# Patient Record
Sex: Female | Born: 1995
Health system: Southern US, Community
[De-identification: ages and names within clinical notes are randomized; demographics above are authoritative.]

## PROBLEM LIST (undated history)

## (undated) DIAGNOSIS — F1219 Cannabis abuse with unspecified cannabis-induced disorder: Secondary | ICD-10-CM

## (undated) DIAGNOSIS — F1299 Cannabis use, unspecified with unspecified cannabis-induced disorder: Secondary | ICD-10-CM

## (undated) DIAGNOSIS — F1290 Cannabis use, unspecified, uncomplicated: Secondary | ICD-10-CM

## (undated) DIAGNOSIS — F32A Depression, unspecified: Secondary | ICD-10-CM

## (undated) DIAGNOSIS — F329 Major depressive disorder, single episode, unspecified: Secondary | ICD-10-CM

## (undated) DIAGNOSIS — R112 Nausea with vomiting, unspecified: Secondary | ICD-10-CM

## (undated) DIAGNOSIS — R1115 Cyclical vomiting syndrome unrelated to migraine: Secondary | ICD-10-CM

## (undated) HISTORY — PX: NO PAST SURGERIES: SHX2092

---
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
## 2014-07-19 ENCOUNTER — Encounter (HOSPITAL_BASED_OUTPATIENT_CLINIC_OR_DEPARTMENT_OTHER): Payer: Self-pay | Admitting: *Deleted

## 2014-07-19 ENCOUNTER — Emergency Department (HOSPITAL_BASED_OUTPATIENT_CLINIC_OR_DEPARTMENT_OTHER)
Admission: EM | Admit: 2014-07-19 | Discharge: 2014-07-19 | Disposition: A | Discharge status: Home / Self Care | Payer: Medicaid Other | Attending: Emergency Medicine | Admitting: Emergency Medicine

## 2014-07-19 DIAGNOSIS — K088 Other specified disorders of teeth and supporting structures: Secondary | ICD-10-CM | POA: Diagnosis not present

## 2014-07-19 DIAGNOSIS — Z720 Tobacco use: Secondary | ICD-10-CM | POA: Diagnosis not present

## 2014-07-19 DIAGNOSIS — K0889 Other specified disorders of teeth and supporting structures: Secondary | ICD-10-CM

## 2014-07-19 DIAGNOSIS — K0381 Cracked tooth: Secondary | ICD-10-CM | POA: Insufficient documentation

## 2014-07-19 MED ORDER — HYDROCODONE-ACETAMINOPHEN 5-325 MG PO TABS
1.0000 | ORAL_TABLET | Freq: Once | ORAL | Status: AC
  Administered 2014-07-19: 1 via ORAL
  Filled 2014-07-19: qty 1

## 2014-07-19 MED ORDER — NAPROXEN 250 MG PO TABS
500.0000 mg | ORAL_TABLET | Freq: Once | ORAL | Status: AC
  Administered 2014-07-19: 500 mg via ORAL
  Filled 2014-07-19: qty 2

## 2014-07-19 MED ORDER — HYDROCODONE-ACETAMINOPHEN 5-325 MG PO TABS: 1.0000 | ORAL_TABLET | ORAL | Status: DC | PRN

## 2014-07-19 MED ORDER — PENICILLIN V POTASSIUM 500 MG PO TABS: 500.0000 mg | ORAL_TABLET | Freq: Four times a day (QID) | ORAL | Status: AC

## 2014-07-19 MED ORDER — NAPROXEN 500 MG PO TABS: 500.0000 mg | ORAL_TABLET | Freq: Two times a day (BID) | ORAL | Status: DC

## 2014-07-19 NOTE — ED Notes (Signed)
D/c home with ride- rx x 3 given for PCN, Vicodin, and naproxen

## 2014-07-19 NOTE — Discharge Instructions (Signed)
Please follow the directions provided. Use the resource guide to establish care with a dentist for treatment of this tooth pain. Please take the antibiotics until they are all gone. Please take the naproxen twice a day to help with pain and inflammation. Take the Vicodin for pain not relieved by the naproxen. Don't hesitate to return for any new, worsening, or concerning symptoms.   SEEK IMMEDIATE MEDICAL CARE IF:  You have a fever.  You develop redness and swelling of your face, jaw, or neck.  You are unable to open your mouth.  You have severe pain uncontrolled by pain medicine.    Emergency Department Resource Guide 1) Find a Doctor and Pay Out of Pocket Although you won't have to find out who is covered by your insurance plan, it is a good idea to ask around and get recommendations. You will then need to call the office and see if the doctor you have chosen will accept you as a new patient and what types of options they offer for patients who are self-pay. Some doctors offer discounts or will set up payment plans for their patients who do not have insurance, but you will need to ask so you aren't surprised when you get to your appointment.  2) Contact Your Local Health Department Not all health departments have doctors that can see patients for sick visits, but many do, so it is worth a call to see if yours does. If you don't know where your local health department is, you can check in your phone book. The CDC also has a tool to help you locate your state's health department, and many state websites also have listings of all of their local health departments.  3) Find a Walk-in Clinic If your illness is not likely to be very severe or complicated, you may want to try a walk in clinic. These are popping up all over the country in pharmacies, drugstores, and shopping centers. They're usually staffed by nurse practitioners or physician assistants that have been trained to treat common illnesses  and complaints. They're usually fairly quick and inexpensive. However, if you have serious medical issues or chronic medical problems, these are probably not your best option.  No Primary Care Doctor: - Call Health Connect at  [phone] - they can help you locate a primary care doctor that  accepts your insurance, provides certain services, etc. - Physician Referral Service- [phone]  Chronic Pain Problems: Organization         Address  Phone   Notes  [facility]  [phone] Patients need to be referred by their primary care doctor.   Medication Assistance: Organization         Address  Phone   Notes  [facility] [address] [place], [place] [address] [phone] --Must be a resident of [place] -- Must have NO insurance coverage whatsoever (no Medicaid/ Medicare, etc.) -- The pt. MUST have a primary care doctor that directs their care regularly and follows them in the community   MedAssist  [phone]   [employer]  [phone]    Agencies that provide inexpensive medical care: Organization         Address  Phone   Notes  [facility]  [phone]   [facility]    [phone]   [facility] [address] [place], [place] [address] [phone]  [facility] [address] [place] [phone]   Planned Parenthood    [phone]   [facility]    [phone]   [place] and [place] [address], [place] Phone:  [phone], Fax:  [phone] Hours of Operation:  9 am - 6 pm, M-F.  Also accepts Medicaid/Medicare and self-pay.  [facility] for [place] [address], [address], [place] Phone: [phone], Fax: [phone]. Hours of Operation:  8:30 am - 5:30 pm, M-F.  Also accepts Medicaid and self-pay.  [facility] [place] [address] [place] Phone: [phone]   [place], [place], [place] [phone], Ext. [phone] Mondays & Thursdays: 7-9 AM.  First 15 patients are seen on a first come, first serve basis.    [place] Providers:  Organization         Address  Phone   Notes  [facility] [address], [place] [phone] Also accepts self-pay patients.  [facility] [address], [place]  [phone]   [place], [address], [place] [phone]   [facility] [address], [place] [phone]   Lucianne Lei [address], [place]   [phone] Only accepts [place] Access [place] patients after they have their name applied to their card.   Self-Pay (no insurance) in [facility] [place]:  Organization         Address  Phone   Notes  Sickle Cell Patients, [facility] [place] [phone]   [facility] [place] [phone]   [facility] [place]  [place], [place], [place] [phone]   [facility]/Dr. Osei-Bonsu  [address] [place] or [address], [place] [phone] Phone number for both [place] and [place] locations is the same.  Urgent Medical and [facility] [address] [place] [phone]   [facility] [address] [place] or [address] [phone]   [facility] [address], [place] [phone], phone; [phone], fax Sees patients 1st and 3rd Saturday of every month.  Must not qualify for public or private insurance (i.e. Medicaid, Medicare, [place] Health Choice, Veterans' Benefits)  Household income should be no more than 200% of the poverty level  The clinic cannot treat you if you are pregnant or think you are pregnant  Sexually transmitted diseases are not treated at the clinic.    Dental Care: Organization         Address  Phone  Notes  [facility] [place] [phone] Accepts children up to age 97 who are enrolled in [place] or [place]; pregnant women with a Medicaid card; and children who have applied for Medicaid or [place] Health Choice, but were declined, whose parents can pay a reduced fee at time of service.  [facility]  [address], [place] [phone] Accepts children up to age 11 who are enrolled in [place] or [place]; pregnant women with a Medicaid card; and  children who have applied for Medicaid or [place] Health Choice, but were declined, whose parents can pay a reduced fee at time of service.  [place] Adult [facility] Access PROGRAM  [place] [phone] Patients are seen by appointment only. Walk-ins are not accepted. [place] will see patients 65 years of age and older. Monday - Tuesday (8am-5pm) Most Wednesdays (8:30-5pm) $30 per visit, cash only  [place] [place] [facility] Access PRO[facility]  [address], [facility] [phone] Patients are seen by appointment only. Walk-ins are not accepted. [place] will see patients 17 years of age and older. One Wednesday Evening (Monthly: Volunteer Based).  $30 per visit, cash only  [place]  [phone] for adults; Children under age 53, call Graduate Pediatric Dentistry at [phone]. Children aged 31-14, please call [phone] to request a pediatric application.  Dental services are provided in all areas of dental care including fillings, crowns and bridges, complete and partial dentures, implants, gum treatment, root canals, and extractions. Preventive care is  also provided. Treatment is provided to both adults and children. Patients are selected via a lottery and there is often a waiting list.   [facility] [address] [place]  [phone] [email or web address]   [facility] [address] [place], [place] [phone], Ext. [phone] Second and Fourth Thursday of each month, opens at 6:30 AM; Clinic ends at 9 AM.  Patients are seen on a first-come first-served basis, and a limited number are seen during each clinic.   [facility]  [address] [place] Danker [place], [place] [phone]   Eligibility Requirements You must have lived in [place], [place], or [place] counties for at least the last three months.   You cannot be eligible for state or federal sponsored [employer], including [employer], [place], or [employer].   You generally cannot be eligible for healthcare insurance through your employer.    How to apply: Eligibility screenings are held every Tuesday and Wednesday afternoon from 1:00 pm until 4:00 pm. You do not need an appointment for the interview!  [facility] [address] [place], [place]   [place]  [place] Department  [place]  [phone]    Behavioral Health Resources in the Community: Intensive Outpatient Programs Organization         Address  Phone  Notes  [place] [address] [place], [place] [phone]   [facility]patient [address], [place], [place]   ADS: Alcohol & Drug Svcs [address] [place], [place]   [place] [address],  [place], [place] or [phone]   Substance Abuse Resources Organization         Address  Phone  Notes  Alcohol and Drug Services  [phone]   [place]  [phone]   [facility] [place]   Chinita Pester  [phone]   Residential & Outpatient Substance Abuse Program  [phone]   Psychological Services Organization         Address  Phone  Notes  [facility] [place]  [place]  [phone]   [place] [address], [place] or [phone]    Mobile Crisis Teams Organization  Address  Phone  Notes  [facility], Mobile Crisis Care Unit  [phone]   [facility] Psychotherapeutic [facility]  [address] [place], [place]   [facility] [address], [place] [phone]    Self-Help/Support Groups Organization         Address  Phone             Notes  [place]. of [place] - variety of support groups  [place] Call for more information  Narcotics Anonymous (NA), [facility] [address], [employer] [place]  2 meetings at this location   Special educational needs teacher         Address  Phone  Notes  [facility] [place],    [place]  [phone]   [facility]  [address] [place] [phone], [place], [place]   [place], [place] [phone] Admissions: 8am-3pm M-F  Incentives Substance [place] [address]    [place], [place] [phone]   [facility] [address] [place], [place], [place]   [facility] [address],  [place], [place]   [facility] - Intensive Outpatient [address], [place], [place]   [facility].,  [place], [place] [phone] or [phone]   [facility] (RTS) [address] [place], [place] Accepts Medicaid  [facility] [place] [address]  [place] [phone]  Substance Abuse/Addiction Treatment   [facility] Organization         Address  Phone  Notes  [facility]  [phone]   Domenic Schwab, PhD [address] [place]lis Port[place], [place]   [phone] or [phone]   [place], [place] [phone]   [facility] [address] [place], [place] [phone] Insurance/Medicaid/sponsorship through [facility] and Families [address], [address] [place]                                    [place], [place] [phone] [place] [address], [place] [phone]    Dr. Adele Schilder  [phone]   [facility] Dept. 1) [address] [place] 2) [place] 3)  [place] [address], [place] [phone]  [phone]   [place] [phone] or [phone] (After Hours)

## 2014-07-19 NOTE — ED Provider Notes (Signed)
CSN: [id number]     Arrival date & time 07/19/14  1858 History   First MD Initiated Contact with Patient 07/19/14 1905     Chief Complaint  Patient presents with  . Dental Pain   (Consider location/radiation/quality/duration/timing/severity/associated sxs/prior Treatment) HPI  Maria Farmer is a 19 yo female presenting with report of tooth pain. She states her pains began last night. She states she has a chipped tooth, but has been chipped for "a while". It has hurt her in the past. This pain began last night while she was at work. She describes the pain as throbbing and currently rates as a 7 out of 10. She states she is tried ibuprofen which helped but the pain continues after the ibuprofen wears off. She denies any fevers, numbness, facial swelling, nausea or vomiting.   History reviewed. No pertinent past medical history. History reviewed. No pertinent past surgical history. History reviewed. No pertinent family history. History  Substance Use Topics  . Smoking status: Current Every Day Smoker -- 0.50 packs/day    Types: Cigarettes  . Smokeless tobacco: Not on file  . Alcohol Use: No   OB History    No data available     Review of Systems  Constitutional: Negative for fever.  HENT: Positive for dental problem. Negative for facial swelling.       Allergies  Review of patient's allergies indicates no known allergies.  Home Medications   Prior to Admission medications   Medication Sig Start Date End Date Taking? Authorizing Provider  etonogestrel (IMPLANON) 68 MG IMPL implant Inject 1 each into the skin once.    Historical Provider, MD   BP 113/54 mmHg  Pulse 80  Temp(Src) 98.2 F (36.8 C) (Oral)  Resp 16  Ht 5\' 2"  (1.575 m)  Wt 130 lb (58.968 kg)  BMI 23.77 kg/m2  SpO2 100% Physical Exam  Constitutional: She appears well-developed and well-nourished. No distress.  HENT:  Head: Normocephalic and atraumatic.  Mouth/Throat: No trismus in the jaw. No dental  abscesses.    Eyes: Conjunctivae are normal.  Neck: Neck supple.  Cardiovascular: Normal rate and regular rhythm.   Pulmonary/Chest: Effort normal and breath sounds normal. No respiratory distress.  Abdominal: Soft.  Lymphadenopathy:    She has no cervical adenopathy.  Neurological: She is alert.  Skin: Skin is warm and dry. She is not diaphoretic.  Psychiatric: She has a normal mood and affect.  Nursing note and vitals reviewed.   ED Course  Procedures (including critical care time) Labs Review Labs Reviewed - No data to display  Imaging Review No results found.   EKG Interpretation None      MDM   Final diagnoses:  Pain, dental   19 yo with toothache but no gross abscess, facial swelling or concern for Ludwig's angina or spread of infection.  Discussed treatment with penicillin and pain medicine.  Resources provided to follow-up with dentist.  Pt verbalizes understanding.    Filed Vitals:   07/19/14 1903  BP: 113/54  Pulse: 80  Temp: 98.2 F (36.8 C)  TempSrc: Oral  Resp: 16  Height: 5\' 2"  (1.575 m)  Weight: 130 lb (58.968 kg)  SpO2: 100%   Meds given in ED:  Medications  HYDROcodone-acetaminophen (NORCO/VICODIN) 5-325 MG per tablet 1 tablet (1 tablet Oral Given 07/19/14 1939)  naproxen (NAPROSYN) tablet 500 mg (500 mg Oral Given 07/19/14 1940)    Discharge Medication List as of 07/19/2014  7:29 PM    START taking these  medications   Details  HYDROcodone-acetaminophen (NORCO/VICODIN) 5-325 MG per tablet Take 1 tablet by mouth every 4 (four) hours as needed for moderate pain or severe pain., Starting 07/19/2014, Until Discontinued, Print    naproxen (NAPROSYN) 500 MG tablet Take 1 tablet (500 mg total) by mouth 2 (two) times daily., Starting 07/19/2014, Until Discontinued, Print    penicillin v potassium (VEETID) 500 MG tablet Take 1 tablet (500 mg total) by mouth 4 (four) times daily., Starting 07/19/2014, Until Mon 07/26/14, Print          Harle Battiest, NP 07/20/14 1239  Elwin Mocha, MD 07/22/14 [phone]

## 2014-07-19 NOTE — ED Notes (Signed)
Pt c/o dental pain x 24 hrs

## 2014-08-23 ENCOUNTER — Encounter (HOSPITAL_BASED_OUTPATIENT_CLINIC_OR_DEPARTMENT_OTHER): Payer: Self-pay | Admitting: Emergency Medicine

## 2014-08-23 ENCOUNTER — Emergency Department (HOSPITAL_BASED_OUTPATIENT_CLINIC_OR_DEPARTMENT_OTHER): Payer: Medicaid Other

## 2014-08-23 ENCOUNTER — Emergency Department (HOSPITAL_BASED_OUTPATIENT_CLINIC_OR_DEPARTMENT_OTHER)
Admission: EM | Admit: 2014-08-23 | Discharge: 2014-08-23 | Disposition: A | Discharge status: Home / Self Care | Payer: Medicaid Other | Attending: Emergency Medicine | Admitting: Emergency Medicine

## 2014-08-23 DIAGNOSIS — Z3202 Encounter for pregnancy test, result negative: Secondary | ICD-10-CM | POA: Diagnosis not present

## 2014-08-23 DIAGNOSIS — R05 Cough: Secondary | ICD-10-CM

## 2014-08-23 DIAGNOSIS — Z720 Tobacco use: Secondary | ICD-10-CM | POA: Insufficient documentation

## 2014-08-23 DIAGNOSIS — B349 Viral infection, unspecified: Secondary | ICD-10-CM | POA: Diagnosis not present

## 2014-08-23 DIAGNOSIS — R059 Cough, unspecified: Secondary | ICD-10-CM

## 2014-08-23 DIAGNOSIS — R109 Unspecified abdominal pain: Secondary | ICD-10-CM | POA: Diagnosis not present

## 2014-08-23 LAB — URINALYSIS, ROUTINE W REFLEX MICROSCOPIC
Bilirubin Urine: NEGATIVE
GLUCOSE, UA: NEGATIVE mg/dL
HGB URINE DIPSTICK: NEGATIVE
Ketones, ur: NEGATIVE mg/dL
Nitrite: NEGATIVE
PROTEIN: NEGATIVE mg/dL
SPECIFIC GRAVITY, URINE: 1.029 (ref 1.005–1.030)
Urobilinogen, UA: 1 mg/dL (ref 0.0–1.0)
pH: 6.5 (ref 5.0–8.0)

## 2014-08-23 LAB — URINE MICROSCOPIC-ADD ON

## 2014-08-23 LAB — PREGNANCY, URINE: Preg Test, Ur: NEGATIVE

## 2014-08-23 MED ORDER — ALBUTEROL SULFATE HFA 108 (90 BASE) MCG/ACT IN AERS: 1.0000 | INHALATION_SPRAY | Freq: Four times a day (QID) | RESPIRATORY_TRACT | Status: DC | PRN

## 2014-08-23 NOTE — ED Provider Notes (Signed)
CSN: [id number]     Arrival date & time 08/23/14  1610 History  This chart was scribed for Glynn OctaveStephen Haidar Muse, MD by Freida Busmaniana Omoyeni, ED Scribe. This patient was seen in room MH12/MH12 and the patient's care was started 5:35 PM.    Chief Complaint  Patient presents with  . Emesis  . Cough     The history is provided by the patient. No language interpreter was used.     HPI Comments:  Maria Farmer is a 19 y.o. female who presents to the Emergency Department complaining of  productive cough that began 5 day ago. She reports associated sore throat, mild intermittent abdominal pain and vomiting. Pt last vomited this morning and notes that some episode have occurred after coughing.  She denies fever. Pt's daughter has had similar symptoms for ~2 weeks. Pt has been taking OTC cough meds with little relief.  LNMP ~1-2 weeks ago. Pt is currently a smoker.     History reviewed. No pertinent past medical history. History reviewed. No pertinent past surgical history. History reviewed. No pertinent family history. History  Substance Use Topics  . Smoking status: Current Every Day Smoker -- 0.50 packs/day    Types: Cigarettes  . Smokeless tobacco: Not on file  . Alcohol Use: No   OB History    No data available     Review of Systems  Constitutional: Negative for fever.  HENT: Positive for sore throat.   Respiratory: Positive for cough.   Gastrointestinal: Positive for vomiting and abdominal pain.  All other systems reviewed and are negative.     Allergies  Review of patient's allergies indicates no known allergies.  Home Medications   Prior to Admission medications   Medication Sig Start Date End Date Taking? Authorizing Provider  albuterol (PROVENTIL HFA;VENTOLIN HFA) 108 (90 BASE) MCG/ACT inhaler Inhale 1-2 puffs into the lungs every 6 (six) hours as needed for wheezing or shortness of breath. 08/23/14   Glynn OctaveStephen Rosalea Withrow, MD  etonogestrel (IMPLANON) 68 MG IMPL implant Inject 1  each into the skin once.    Historical Provider, MD  HYDROcodone-acetaminophen (NORCO/VICODIN) 5-325 MG per tablet Take 1 tablet by mouth every 4 (four) hours as needed for moderate pain or severe pain. 07/19/14   Harle BattiestElizabeth Tysinger, NP  naproxen (NAPROSYN) 500 MG tablet Take 1 tablet (500 mg total) by mouth 2 (two) times daily. 07/19/14   Harle BattiestElizabeth Tysinger, NP   BP 118/70 mmHg  Pulse 96  Temp(Src) 98.5 F (36.9 C) (Oral)  Resp 18  Ht 5\' 2"  (1.575 m)  Wt 125 lb (56.7 kg)  BMI 22.86 kg/m2  SpO2 99%  LMP 08/09/2014 Physical Exam  Constitutional: She is oriented to person, place, and time. She appears well-developed and well-nourished. No distress.  HENT:  Head: Normocephalic and atraumatic.  Mouth/Throat: Oropharynx is clear and moist. No oropharyngeal exudate.  Eyes: Conjunctivae and EOM are normal. Pupils are equal, round, and reactive to light.  Neck: Normal range of motion. Neck supple.  No meningismus.  Cardiovascular: Normal rate, regular rhythm, normal heart sounds and intact distal pulses.   No murmur heard. Pulmonary/Chest: Effort normal and breath sounds normal. No respiratory distress.  Dry cough noted  Abdominal: Soft. There is no tenderness. There is no rebound and no guarding.  Musculoskeletal: Normal range of motion. She exhibits no edema or tenderness.  Neurological: She is alert and oriented to person, place, and time. No cranial nerve deficit. She exhibits normal muscle tone. Coordination normal.  No ataxia on  finger to nose bilaterally. No pronator drift. 5/5 strength throughout. CN 2-12 intact. Negative Romberg. Equal grip strength. Sensation intact. Gait is normal.   Skin: Skin is warm.  Psychiatric: She has a normal mood and affect. Her behavior is normal.  Nursing note and vitals reviewed.   ED Course  Procedures   DIAGNOSTIC STUDIES:  Oxygen Saturation is 99% on RA, normal by my interpretation.    COORDINATION OF CARE:  5:42 PM Discussed treatment plan  with pt at bedside and pt agreed to plan.  Labs Review Labs Reviewed  URINALYSIS, ROUTINE W REFLEX MICROSCOPIC - Abnormal; Notable for the following:    APPearance CLOUDY (*)    Leukocytes, UA SMALL (*)    All other components within normal limits  URINE MICROSCOPIC-ADD ON - Abnormal; Notable for the following:    Squamous Epithelial / LPF MANY (*)    Bacteria, UA FEW (*)    All other components within normal limits  PREGNANCY, URINE    Imaging Review Dg Chest 2 View  08/23/2014   CLINICAL DATA:  Productive cough, vomiting  EXAM: CHEST  2 VIEW  COMPARISON:  None.  FINDINGS: Lungs are clear.  No pleural effusion or pneumothorax.  The heart is normal in size.  Visualized osseous structures are within normal limits.  IMPRESSION: Normal chest radiographs.   Electronically Signed   By: Charline Bills M.D.   On: 08/23/2014 17:48     EKG Interpretation None      MDM   Final diagnoses:  Viral syndrome  Cough   4 day history of coughing with posttussive emesis, congestion. Daughter with similar symptoms. No document a fever.  Chest x-ray negative. Tolerating by mouth in the ED. No hypoxia or wheezing. Supportive care for viral bronchitis. Smoking cessation discussed.  I personally performed the services described in this documentation, which was scribed in my presence. The recorded information has been reviewed and is accurate.   Glynn Octave, MD 08/24/14 [phone]

## 2014-08-23 NOTE — ED Notes (Signed)
Pt states she has been vomiting since Thursday

## 2014-08-23 NOTE — Discharge Instructions (Signed)
Cough, Adult  A cough is a reflex that helps clear your throat and airways. It can help heal the body or may be a reaction to an irritated airway. A cough may only last 2 or 3 weeks (acute) or may last more than 8 weeks (chronic).  CAUSES Acute cough:  Viral or bacterial infections. Chronic cough:  Infections.  Allergies.  Asthma.  Post-nasal drip.  Smoking.  Heartburn or acid reflux.  Some medicines.  Chronic lung problems (COPD).  Cancer. SYMPTOMS   Cough.  Fever.  Chest pain.  Increased breathing rate.  High-pitched whistling sound when breathing (wheezing).  Colored mucus that you cough up (sputum). TREATMENT   A bacterial cough may be treated with antibiotic medicine.  A viral cough must run its course and will not respond to antibiotics.  Your caregiver may recommend other treatments if you have a chronic cough. HOME CARE INSTRUCTIONS   Only take over-the-counter or prescription medicines for pain, discomfort, or fever as directed by your caregiver. Use cough suppressants only as directed by your caregiver.  Use a cold steam vaporizer or humidifier in your bedroom or home to help loosen secretions.  Sleep in a semi-upright position if your cough is worse at night.  Rest as needed.  Stop smoking if you smoke. SEEK IMMEDIATE MEDICAL CARE IF:   You have pus in your sputum.  Your cough starts to worsen.  You cannot control your cough with suppressants and are losing sleep.  You begin coughing up blood.  You have difficulty breathing.  You develop pain which is getting worse or is uncontrolled with medicine.  You have a fever. MAKE SURE YOU:   Understand these instructions.  Will watch your condition.  Will get help right away if you are not doing well or get worse. Document Released: 11/24/2010 Document Revised: 08/20/2011 Document Reviewed: 11/24/2010 ExitCare Patient Information 2015 [employer]. This information is not intended  to replace advice given to you by your health care provider. Make sure you discuss any questions you have with your health care provider.  

## 2014-12-28 ENCOUNTER — Encounter (HOSPITAL_BASED_OUTPATIENT_CLINIC_OR_DEPARTMENT_OTHER): Payer: Self-pay | Admitting: *Deleted

## 2014-12-28 ENCOUNTER — Inpatient Hospital Stay (HOSPITAL_BASED_OUTPATIENT_CLINIC_OR_DEPARTMENT_OTHER)
Admission: EM | Admit: 2014-12-28 | Discharge: 2014-12-31 | DRG: 392 | Disposition: A | Discharge status: Home / Self Care | Payer: Medicaid Other | Attending: Internal Medicine | Admitting: Internal Medicine

## 2014-12-28 DIAGNOSIS — R109 Unspecified abdominal pain: Secondary | ICD-10-CM | POA: Diagnosis not present

## 2014-12-28 DIAGNOSIS — Z7151 Drug abuse counseling and surveillance of drug abuser: Secondary | ICD-10-CM | POA: Diagnosis present

## 2014-12-28 DIAGNOSIS — N390 Urinary tract infection, site not specified: Secondary | ICD-10-CM

## 2014-12-28 DIAGNOSIS — R1115 Cyclical vomiting syndrome unrelated to migraine: Secondary | ICD-10-CM

## 2014-12-28 DIAGNOSIS — R112 Nausea with vomiting, unspecified: Secondary | ICD-10-CM | POA: Diagnosis not present

## 2014-12-28 DIAGNOSIS — F17210 Nicotine dependence, cigarettes, uncomplicated: Secondary | ICD-10-CM | POA: Diagnosis present

## 2014-12-28 DIAGNOSIS — R1110 Vomiting, unspecified: Secondary | ICD-10-CM | POA: Diagnosis not present

## 2014-12-28 DIAGNOSIS — F1290 Cannabis use, unspecified, uncomplicated: Secondary | ICD-10-CM | POA: Diagnosis present

## 2014-12-28 HISTORY — DX: Depression, unspecified: F32.A

## 2014-12-28 HISTORY — DX: Major depressive disorder, single episode, unspecified: F32.9

## 2014-12-28 LAB — CBC WITH DIFFERENTIAL/PLATELET
BASOS PCT: 0 % (ref 0–1)
Basophils Absolute: 0 10*3/uL (ref 0.0–0.1)
Eosinophils Absolute: 0.1 10*3/uL (ref 0.0–0.7)
Eosinophils Relative: 1 % (ref 0–5)
HCT: 39.6 % (ref 36.0–46.0)
HEMOGLOBIN: 13.9 g/dL (ref 12.0–15.0)
LYMPHS ABS: 2.2 10*3/uL (ref 0.7–4.0)
LYMPHS PCT: 35 % (ref 12–46)
MCH: 30.6 pg (ref 26.0–34.0)
MCHC: 35.1 g/dL (ref 30.0–36.0)
MCV: 87.2 fL (ref 78.0–100.0)
Monocytes Absolute: 0.6 10*3/uL (ref 0.1–1.0)
Monocytes Relative: 9 % (ref 3–12)
NEUTROS ABS: 3.4 10*3/uL (ref 1.7–7.7)
NEUTROS PCT: 55 % (ref 43–77)
Platelets: 210 10*3/uL (ref 150–400)
RBC: 4.54 MIL/uL (ref 3.87–5.11)
RDW: 12.7 % (ref 11.5–15.5)
WBC: 6.3 10*3/uL (ref 4.0–10.5)

## 2014-12-28 LAB — COMPREHENSIVE METABOLIC PANEL
ALBUMIN: 4.5 g/dL (ref 3.5–5.0)
ALT: 11 U/L — ABNORMAL LOW (ref 14–54)
ANION GAP: 10 (ref 5–15)
AST: 20 U/L (ref 15–41)
Alkaline Phosphatase: 47 U/L (ref 38–126)
BILIRUBIN TOTAL: 0.8 mg/dL (ref 0.3–1.2)
BUN: 6 mg/dL (ref 6–20)
CALCIUM: 9.5 mg/dL (ref 8.9–10.3)
CO2: 24 mmol/L (ref 22–32)
Chloride: 105 mmol/L (ref 101–111)
Creatinine, Ser: 0.89 mg/dL (ref 0.44–1.00)
GFR calc non Af Amer: >60 mL/min (ref >60)
Glucose, Bld: 113 mg/dL — ABNORMAL HIGH (ref 65–99)
Potassium: 3.5 mmol/L (ref 3.5–5.1)
SODIUM: 139 mmol/L (ref 135–145)
Total Protein: 8.2 g/dL — ABNORMAL HIGH (ref 6.5–8.1)

## 2014-12-28 LAB — LIPASE, BLOOD: Lipase: 20 U/L — ABNORMAL LOW (ref 22–51)

## 2014-12-28 LAB — URINALYSIS, ROUTINE W REFLEX MICROSCOPIC
Bilirubin Urine: NEGATIVE
Glucose, UA: NEGATIVE mg/dL
KETONES UR: NEGATIVE mg/dL
Nitrite: NEGATIVE
PROTEIN: 100 mg/dL — AB
Specific Gravity, Urine: 1.014 (ref 1.005–1.030)
Urobilinogen, UA: 0.2 mg/dL (ref 0.0–1.0)
pH: 8 (ref 5.0–8.0)

## 2014-12-28 LAB — URINE MICROSCOPIC-ADD ON

## 2014-12-28 LAB — PREGNANCY, URINE: PREG TEST UR: NEGATIVE

## 2014-12-28 MED ORDER — ONDANSETRON HCL 4 MG PO TABS: 4.0000 mg | ORAL_TABLET | Freq: Four times a day (QID) | ORAL | Status: DC | PRN

## 2014-12-28 MED ORDER — CEFTRIAXONE SODIUM IN DEXTROSE 20 MG/ML IV SOLN
1.0000 g | INTRAVENOUS | Status: DC
  Administered 2014-12-29 – 2014-12-30 (×2): 1 g via INTRAVENOUS
  Filled 2014-12-28 (×3): qty 50

## 2014-12-28 MED ORDER — ACETAMINOPHEN 650 MG RE SUPP
650.0000 mg | Freq: Four times a day (QID) | RECTAL | Status: DC | PRN
  Administered 2014-12-29: 650 mg via RECTAL
  Filled 2014-12-28: qty 1

## 2014-12-28 MED ORDER — SODIUM CHLORIDE 0.9 % IV SOLN: INTRAVENOUS | Status: DC

## 2014-12-28 MED ORDER — ONDANSETRON HCL 4 MG/2ML IJ SOLN
4.0000 mg | Freq: Once | INTRAMUSCULAR | Status: AC
  Administered 2014-12-28: 4 mg via INTRAVENOUS
  Filled 2014-12-28: qty 2

## 2014-12-28 MED ORDER — SODIUM CHLORIDE 0.9 % IV SOLN
INTRAVENOUS | Status: AC
  Administered 2014-12-28: via INTRAVENOUS

## 2014-12-28 MED ORDER — ACETAMINOPHEN 325 MG PO TABS: 650.0000 mg | ORAL_TABLET | Freq: Four times a day (QID) | ORAL | Status: DC | PRN

## 2014-12-28 MED ORDER — PROMETHAZINE HCL 25 MG/ML IJ SOLN
12.5000 mg | Freq: Once | INTRAMUSCULAR | Status: AC
  Administered 2014-12-28: 12.5 mg via INTRAVENOUS
  Filled 2014-12-28: qty 1

## 2014-12-28 MED ORDER — ALBUTEROL SULFATE (2.5 MG/3ML) 0.083% IN NEBU: 3.0000 mL | INHALATION_SOLUTION | Freq: Four times a day (QID) | RESPIRATORY_TRACT | Status: DC | PRN

## 2014-12-28 MED ORDER — SODIUM CHLORIDE 0.9 % IV BOLUS (SEPSIS)
1000.0000 mL | Freq: Once | INTRAVENOUS | Status: AC
  Administered 2014-12-28: 1000 mL via INTRAVENOUS

## 2014-12-28 MED ORDER — CEFTRIAXONE SODIUM 1 G IJ SOLR
INTRAMUSCULAR | Status: AC
  Filled 2014-12-28: qty 10

## 2014-12-28 MED ORDER — DEXTROSE 5 % IV SOLN
1.0000 g | Freq: Once | INTRAVENOUS | Status: AC
  Administered 2014-12-28: 1 g via INTRAVENOUS

## 2014-12-28 MED ORDER — ONDANSETRON HCL 4 MG/2ML IJ SOLN
4.0000 mg | Freq: Four times a day (QID) | INTRAMUSCULAR | Status: DC | PRN
  Administered 2014-12-28 – 2014-12-31 (×8): 4 mg via INTRAVENOUS
  Filled 2014-12-28 (×10): qty 2

## 2014-12-28 NOTE — ED Notes (Signed)
Vomiting since this am. Left abdominal pain.

## 2014-12-28 NOTE — ED Provider Notes (Signed)
CSN: [id number]     Arrival date & time 12/28/14  1449 History   First MD Initiated Contact with Patient 12/28/14 1502     Chief Complaint  Patient presents with  . Emesis  . Abdominal Pain     (Consider location/radiation/quality/duration/timing/severity/associated sxs/prior Treatment) Patient is a 19 y.o. female presenting with vomiting and abdominal pain. The history is provided by the patient.  Emesis Severity:  Moderate Associated symptoms: abdominal pain and chills   Abdominal Pain Associated symptoms: chills, nausea, vaginal bleeding and vomiting   Associated symptoms: no chest pain and no fever    patient states that she developed left lower abdominal pain earlier today. Then follow with nausea and vomiting. No diarrhea. No fevers. She is actively vomiting exam somewhat difficult historian because of it. She states her menses started couple days ago. Denies vaginal discharge. She states she's had some chills. The pain is dull and constant.  History reviewed. No pertinent past medical history. History reviewed. No pertinent past surgical history. No family history on file. History  Substance Use Topics  . Smoking status: Current Every Day Smoker -- 0.50 packs/day    Types: Cigarettes  . Smokeless tobacco: Not on file  . Alcohol Use: No   OB History    No data available     Review of Systems  Constitutional: Positive for chills. Negative for fever and appetite change.  Respiratory: Negative for chest tightness.   Cardiovascular: Negative for chest pain.  Gastrointestinal: Positive for nausea, vomiting and abdominal pain.  Genitourinary: Positive for vaginal bleeding. Negative for dyspareunia.  Skin: Negative for wound.  Neurological: Negative for light-headedness.  Hematological: Negative for adenopathy.      Allergies  Review of patient's allergies indicates no known allergies.  Home Medications   Prior to Admission medications   Medication Sig Start Date  End Date Taking? Authorizing Provider  albuterol (PROVENTIL HFA;VENTOLIN HFA) 108 (90 BASE) MCG/ACT inhaler Inhale 1-2 puffs into the lungs every 6 (six) hours as needed for wheezing or shortness of breath. 08/23/14   Glynn OctaveStephen Rancour, MD  etonogestrel (IMPLANON) 68 MG IMPL implant Inject 1 each into the skin once.    Historical Provider, MD  HYDROcodone-acetaminophen (NORCO/VICODIN) 5-325 MG per tablet Take 1 tablet by mouth every 4 (four) hours as needed for moderate pain or severe pain. 07/19/14   Harle BattiestElizabeth Tysinger, NP  naproxen (NAPROSYN) 500 MG tablet Take 1 tablet (500 mg total) by mouth 2 (two) times daily. 07/19/14   Harle BattiestElizabeth Tysinger, NP   BP 106/67 mmHg  Pulse 76  Temp(Src) 98.9 F (37.2 C) (Oral)  Resp 16  Ht 5\' 3"  (1.6 m)  Wt 125 lb (56.7 kg)  BMI 22.15 kg/m2  SpO2 100%  LMP 11/28/2014 Physical Exam  Constitutional: She appears well-developed.  Cardiovascular: Normal rate and regular rhythm.   Pulmonary/Chest: Effort normal.  Abdominal: Soft.  Mild suprapubic to left lower quadrant abdominal pain. No rebound or guarding. No hernias. Patient is actively vomiting  Neurological: She is alert.    ED Course  Procedures (including critical care time) Labs Review Labs Reviewed  URINALYSIS, ROUTINE W REFLEX MICROSCOPIC (NOT AT [facility]) - Abnormal; Notable for the following:    APPearance CLOUDY (*)    Hgb urine dipstick LARGE (*)    Protein, ur 100 (*)    Leukocytes, UA MODERATE (*)    All other components within normal limits  COMPREHENSIVE METABOLIC PANEL - Abnormal; Notable for the following:    Glucose, Bld 113 (*)  Total Protein 8.2 (*)    ALT 11 (*)    All other components within normal limits  LIPASE, BLOOD - Abnormal; Notable for the following:    Lipase 20 (*)    All other components within normal limits  URINE MICROSCOPIC-ADD ON - Abnormal; Notable for the following:    Squamous Epithelial / LPF FEW (*)    Bacteria, UA FEW (*)    All other components within  normal limits  URINE CULTURE  PREGNANCY, URINE  CBC WITH DIFFERENTIAL/PLATELET    Imaging Review No results found.   EKG Interpretation None      MDM   Final diagnoses:  UTI (lower urinary tract infection)  Persistent vomiting    Patient with nausea vomiting and lower abdominal pain. Has apparent urinary tract infection. Tenderness is all resolved with Zofran. For still's vomiting will not tolerate oral medicines such as her antibodies. Will admit to internal medicine. Discussed with Dr. Trinna Balloon, MD 12/28/14 2033

## 2014-12-28 NOTE — H&P (Signed)
[facility] History and Physical  Maria Farmer [id number] DOB: 11/18/1995 DOA: 12/28/2014  Referring physician: Patient was transferred from [facility] [place]. PCP: No PCP Per Patient none. Specialists: None.  Chief Complaint: Nausea vomiting.  HPI: Maria ConferDynasia Seman is a 19 y.o. female with no significant past medical history presents to the ER because of abdominal pain and nausea vomiting. Patient's symptoms started in the morning with left lower quadrant pain with multiple episodes of nausea and vomiting. Denies any diarrhea fever chills. Patient has been having some dysuria. Pain in the left lower quadrant was moving up to her left flank area. Patient's pain improved in the ER to pain relief medications. But patient still had persistent vomiting and has been admitted for further management. UA shows features consistent with UTI. Abdominal exam is benign.   Review of Systems: As presented in the history of presenting illness, rest negative.  Past Medical History  Diagnosis Date  . Medical history non-contributory    Past Surgical History  Procedure Laterality Date  . No past surgeries     Social History:  reports that she has been smoking Cigarettes.  She has been smoking about 0.50 packs per day. She does not have any smokeless tobacco history on file. She reports that she does not drink alcohol. Her drug history is not on file. Where does patient live home. Can patient participate in ADLs? Yes.  No Known Allergies  Family History:  Family History  Problem Relation Age of Onset  . Diabetes Mellitus II Maternal Grandmother       Prior to Admission medications   Medication Sig Start Date End Date Taking? Authorizing Provider  albuterol (PROVENTIL HFA;VENTOLIN HFA) 108 (90 BASE) MCG/ACT inhaler Inhale 1-2 puffs into the lungs every 6 (six) hours as needed for wheezing or shortness of breath. 08/23/14   Glynn OctaveStephen Rancour, MD  etonogestrel (IMPLANON) 68 MG IMPL implant  Inject 1 each into the skin once.    Historical Provider, MD  HYDROcodone-acetaminophen (NORCO/VICODIN) 5-325 MG per tablet Take 1 tablet by mouth every 4 (four) hours as needed for moderate pain or severe pain. 07/19/14   Harle BattiestElizabeth Tysinger, NP  naproxen (NAPROSYN) 500 MG tablet Take 1 tablet (500 mg total) by mouth 2 (two) times daily. 07/19/14   Harle BattiestElizabeth Tysinger, NP    Physical Exam: Filed Vitals:   12/28/14 1453 12/28/14 1931 12/28/14 2105 12/28/14 2251  BP: 93/67 106/67 111/82 129/90  Pulse: 101 76 76 66  Temp: 98.4 F (36.9 C) 98.9 F (37.2 C) 98.7 F (37.1 C) 99.2 F (37.3 C)  TempSrc: Oral Oral Oral Oral  Resp: 18 16 18 20   Height: 5\' 3"  (1.6 m)     Weight: 56.7 kg (125 lb)     SpO2: 100% 100% 99% 100%     General:  Moderately built and nourished.  Eyes: Anicteric no pallor.  ENT: No discharge from the ears eyes nose and mouth.  Neck: No mass felt.  Cardiovascular: S1 and S2 heard.  Respiratory: No rhonchi or crepitations.  Abdomen: Soft nontender bowel sounds present.  Skin: No rash.  Musculoskeletal: No edema.  Psychiatric: Appears normal.  Neurologic: Alert awake oriented to time place and person. Moves all extremities.  Labs on Admission:  Basic Metabolic Panel:  Recent Labs Lab 12/28/14 1520  NA 139  K 3.5  CL 105  CO2 24  GLUCOSE 113*  BUN 6  CREATININE 0.89  CALCIUM 9.5   Liver Function Tests:  Recent Labs Lab 12/28/14  1520  AST 20  ALT 11*  ALKPHOS 47  BILITOT 0.8  PROT 8.2*  ALBUMIN 4.5    Recent Labs Lab 12/28/14 1520  LIPASE 20*   No results for input(s): AMMONIA in the last 168 hours. CBC:  Recent Labs Lab 12/28/14 1520  WBC 6.3  NEUTROABS 3.4  HGB 13.9  HCT 39.6  MCV 87.2  PLT 210   Cardiac Enzymes: No results for input(s): CKTOTAL, CKMB, CKMBINDEX, TROPONINI in the last 168 hours.  BNP (last 3 results) No results for input(s): BNP in the last 8760 hours.  ProBNP (last 3 results) No results for  input(s): PROBNP in the last 8760 hours.  CBG: No results for input(s): GLUCAP in the last 168 hours.  Radiological Exams on Admission: No results found.   Assessment/Plan Principal Problem:   Nausea with vomiting Active Problems:   UTI (lower urinary tract infection)   Abdominal pain   Nausea & vomiting   1. Nausea vomiting with abdominal pain - most likely secondary to UTI. Concerning for pyelonephritis. Urine cultures have been ordered. Patient is on ceftriaxone. Since patient still has persistent nausea and vomiting CT abdomen and pelvis has been ordered. Continue gentle hydration. Check urine drug screen. 2. UTI - see #1.   DVT Prophylaxis SCDs. Code Status: Full code.  Family Communication: Patient's mother the bedside.  Disposition Plan: Admit to inpatient.    Auden Tatar N. [facility] Pager [phone].  If 7PM-7AM, please contact night-coverage [email or web address] Password [email or web address] 12/28/2014, 11:14 PM

## 2014-12-28 NOTE — Progress Notes (Signed)
ANTIBIOTIC CONSULT NOTE - INITIAL  Pharmacy Consult for Ceftriaxone  Indication: UTI  No Known Allergies  Patient Measurements: Height: 5\' 3"  (160 cm) Weight: 125 lb (56.7 kg) IBW/kg (Calculated) : 52.4  Vital Signs: Temp: 99.2 F (37.3 C) (07/19 2251) Temp Source: Oral (07/19 2251) BP: 129/90 mmHg (07/19 2251) Pulse Rate: 66 (07/19 2251)  Labs:  Recent Labs  12/28/14 1520  WBC 6.3  HGB 13.9  PLT 210  CREATININE 0.89   Estimated Creatinine Clearance: 83.4 mL/min (by C-G formula based on Cr of 0.89).  Medical History: Past Medical History  Diagnosis Date  . Medical history non-contributory    Assessment: 19 y/o F with emesis/abdominal pain, WBC WNL, renal function good, mildly abnormal UA, to start ceftriaxone per pharmacy for UTI.   Plan:  -Ceftriaxone 1g IV q24h -F/U urine culture  Abran DukeLedford, Liston Thum 12/28/2014,11:23 PM

## 2014-12-28 NOTE — ED Notes (Signed)
Patient given ginger ale for oral trial.

## 2014-12-28 NOTE — ED Notes (Signed)
Report given to [facility] 

## 2014-12-29 ENCOUNTER — Inpatient Hospital Stay (HOSPITAL_COMMUNITY): Payer: Medicaid Other

## 2014-12-29 ENCOUNTER — Encounter (HOSPITAL_COMMUNITY): Payer: Self-pay | Admitting: *Deleted

## 2014-12-29 DIAGNOSIS — R1110 Vomiting, unspecified: Secondary | ICD-10-CM

## 2014-12-29 LAB — CBC WITH DIFFERENTIAL/PLATELET
BASOS ABS: 0 10*3/uL (ref 0.0–0.1)
Basophils Relative: 0 % (ref 0–1)
EOS ABS: 0 10*3/uL (ref 0.0–0.7)
Eosinophils Relative: 0 % (ref 0–5)
HCT: 36.8 % (ref 36.0–46.0)
Hemoglobin: 12.9 g/dL (ref 12.0–15.0)
LYMPHS PCT: 15 % (ref 12–46)
Lymphs Abs: 1 10*3/uL (ref 0.7–4.0)
MCH: 30.3 pg (ref 26.0–34.0)
MCHC: 35.1 g/dL (ref 30.0–36.0)
MCV: 86.4 fL (ref 78.0–100.0)
Monocytes Absolute: 0.4 10*3/uL (ref 0.1–1.0)
Monocytes Relative: 6 % (ref 3–12)
NEUTROS ABS: 5.3 10*3/uL (ref 1.7–7.7)
NEUTROS PCT: 79 % — AB (ref 43–77)
Platelets: 184 10*3/uL (ref 150–400)
RBC: 4.26 MIL/uL (ref 3.87–5.11)
RDW: 12.9 % (ref 11.5–15.5)
WBC: 6.6 10*3/uL (ref 4.0–10.5)

## 2014-12-29 LAB — COMPREHENSIVE METABOLIC PANEL
ALBUMIN: 3.9 g/dL (ref 3.5–5.0)
ALK PHOS: 42 U/L (ref 38–126)
ALT: 14 U/L (ref 14–54)
AST: 19 U/L (ref 15–41)
Anion gap: 8 (ref 5–15)
BILIRUBIN TOTAL: 1 mg/dL (ref 0.3–1.2)
BUN: 6 mg/dL (ref 6–20)
CO2: 24 mmol/L (ref 22–32)
Calcium: 9.1 mg/dL (ref 8.9–10.3)
Chloride: 105 mmol/L (ref 101–111)
Creatinine, Ser: 0.82 mg/dL (ref 0.44–1.00)
GFR calc Af Amer: >60 mL/min (ref >60)
GFR calc non Af Amer: >60 mL/min (ref >60)
Glucose, Bld: 116 mg/dL — ABNORMAL HIGH (ref 65–99)
Potassium: 3.8 mmol/L (ref 3.5–5.1)
Sodium: 137 mmol/L (ref 135–145)
Total Protein: 7.1 g/dL (ref 6.5–8.1)

## 2014-12-29 LAB — RAPID URINE DRUG SCREEN, HOSP PERFORMED
Amphetamines: NOT DETECTED
BENZODIAZEPINES: NOT DETECTED
Barbiturates: NOT DETECTED
COCAINE: NOT DETECTED
OPIATES: NOT DETECTED
Tetrahydrocannabinol: POSITIVE — AB

## 2014-12-29 MED ORDER — METOCLOPRAMIDE HCL 5 MG/ML IJ SOLN
5.0000 mg | Freq: Four times a day (QID) | INTRAMUSCULAR | Status: DC | PRN
  Administered 2014-12-29 – 2014-12-31 (×5): 5 mg via INTRAVENOUS
  Filled 2014-12-29 (×5): qty 2

## 2014-12-29 MED ORDER — IOHEXOL 300 MG/ML  SOLN
25.0000 mL | INTRAMUSCULAR | Status: AC
  Administered 2014-12-29 (×2): 25 mL via ORAL

## 2014-12-29 MED ORDER — IOHEXOL 300 MG/ML  SOLN
80.0000 mL | Freq: Once | INTRAMUSCULAR | Status: AC | PRN
  Administered 2014-12-29: 80 mL via INTRAVENOUS

## 2014-12-29 MED ORDER — PNEUMOCOCCAL VAC POLYVALENT 25 MCG/0.5ML IJ INJ
0.5000 mL | INJECTION | INTRAMUSCULAR | Status: DC
  Filled 2014-12-29: qty 0.5

## 2014-12-29 MED ORDER — PROMETHAZINE HCL 25 MG/ML IJ SOLN
12.5000 mg | Freq: Once | INTRAMUSCULAR | Status: AC
  Administered 2014-12-29: 12.5 mg via INTRAVENOUS
  Filled 2014-12-29: qty 1

## 2014-12-29 NOTE — Care Management Note (Signed)
Case Management Note  Patient Details  Name: Maria Farmer MRN: [name] Date of Birth: 06-16-1995  Subjective/Objective:                 Patient from home, lives with family. Has medicaid, reviewed how to obtain PCP with patient through medicaid. Patient still symptomatic with nausea/ pyelonephritis.    Action/Plan:  Anticipate discharge to home; self care in next day or two. Expected Discharge Date:                  Expected Discharge Plan:  Home/Self Care  In-House Referral:     Discharge planning Services  CM Consult  Post Acute Care Choice:    Choice offered to:     DME Arranged:    DME Agency:     HH Arranged:    HH Agency:     Status of Service:  In process, will continue to follow  Medicare Important Message Given:    Date Medicare IM Given:    Medicare IM give by:    Date Additional Medicare IM Given:    Additional Medicare Important Message give by:     If discussed at Long Length of Stay Meetings, dates discussed:    Additional Comments:  Lawerance SabalDebbie Jaeven Wanzer, RN 12/29/2014, 11:31 AM

## 2014-12-29 NOTE — Progress Notes (Signed)
[facility] PROGRESS NOTE  Maria Farmer [id number] DOB: Mar 07, 1996 DOA: 12/28/2014 PCP: No PCP Per Patient  Assessment/Plan: Principal Problem:   Nausea with vomiting Active Problems:   UTI (lower urinary tract infection)   Abdominal pain   Nausea & vomiting    1. Nausea vomiting with abdominal pain - most likely secondary to UTI. Concern for pyelonephritis, however CT scan negative for pyelonephritis, no hydronephrosis, no perirenal abscess,. Follow urine culture for 24-48 hours. Patient is on ceftriaxone. Since patient still has persistent nausea and vomiting probably exacerbated by marijuana use . Advance to soft diet 2. Continue gentle hydration. Continue IV fluids 3. Substance abuse-counseling done   Code Status:      Code Status Orders        Start     Ordered   12/28/14 2314  Full code   Continuous     12/28/14 2314     Family Communication: family updated about patient's clinical progress Disposition Plan:  Anticipate discharge tomorrow after nausea improves   Brief narrative: Maria Farmer is a 19 y.o. female with no significant past medical history presents to the ER because of abdominal pain and nausea vomiting. Patient's symptoms started in the morning with left lower quadrant pain with multiple episodes of nausea and vomiting. Denies any diarrhea fever chills. Patient has been having some dysuria. Pain in the left lower quadrant was moving up to her left flank area. Patient's pain improved in the ER to pain relief medications. But patient still had persistent vomiting and has been admitted for further management. UA shows features consistent with UTI. Abdominal exam is benign.    Consultants:  None  Procedures:  None  Antibiotics: Anti-infectives    Start     Dose/Rate Route Frequency Ordered Stop   12/29/14 2000  cefTRIAXone (ROCEPHIN) 1 g in dextrose 5 % 50 mL IVPB - Premix     1 g 100 mL/hr over 30 Minutes Intravenous Every 24  hours 12/28/14 2323     12/28/14 2015  cefTRIAXone (ROCEPHIN) 1 g in dextrose 5 % 50 mL IVPB     1 g 100 mL/hr over 30 Minutes Intravenous  Once 12/28/14 2014 12/28/14 2056   12/28/14 2015  cefTRIAXone (ROCEPHIN) 1 G injection    Comments:  Gertie Baron   : cabinet override      12/28/14 2015 12/29/14 0829         HPI/Subjective: Complaining of nausea, no vomiting, no abdominal pain afebrile overnight  Objective: Filed Vitals:   12/28/14 1931 12/28/14 2105 12/28/14 2251 12/29/14 0546  BP: 106/67 111/82 129/90 125/73  Pulse: 76 76 66 60  Temp: 98.9 F (37.2 C) 98.7 F (37.1 C) 99.2 F (37.3 C) 99.5 F (37.5 C)  TempSrc: Oral Oral Oral Oral  Resp: Height:      Weight:      SpO2: 100% 99% 100% 100%    Intake/Output Summary (Last 24 hours) at 12/29/14 1212 Last data filed at 12/29/14 8119  Gross per 24 hour  Intake 563.75 ml  Output    950 ml  Net -386.25 ml    Exam:  General: No acute respiratory distress Lungs: Clear to auscultation bilaterally without wheezes or crackles Cardiovascular: Regular rate and rhythm without murmur gallop or rub normal S1 and S2 Abdomen: Nontender, nondistended, soft, bowel sounds positive, no rebound, no ascites, no appreciable mass Extremities: No significant cyanosis, clubbing, or edema bilateral lower extremities  Data Review   Micro Results Recent Results (from the past 240 hour(s))  Urine culture     Status: None (Preliminary result)   Collection Time: 12/28/14  2:52 PM  Result Value Ref Range Status   Specimen Description URINE, CLEAN CATCH  Final   Special Requests NONE  Final   Culture   Final    CULTURE REINCUBATED FOR BETTER GROWTH Performed at [facility]    Report Status PENDING  Incomplete    Radiology Reports Ct Abdomen Pelvis W Contrast  12/29/2014   CLINICAL DATA:  Abdominal pain with nausea and vomiting for 2 days  EXAM: CT ABDOMEN AND PELVIS WITH CONTRAST  TECHNIQUE:  Multidetector CT imaging of the abdomen and pelvis was performed using the standard protocol following bolus administration of intravenous contrast.  CONTRAST:  80mL OMNIPAQUE IOHEXOL 300 MG/ML  SOLN  COMPARISON:  None.  FINDINGS: Lung bases are clear.  There is a 3 mm cyst versus hamartoma at the level of the dome of the liver on the right. No other liver lesions are appreciable. The gallbladder wall is not thickened. There is no biliary duct dilatation.  Spleen, pancreas, and adrenals appear normal.  Kidneys bilaterally show no evidence of mass or hydronephrosis on either side. There is no renal or ureteral calculus on either side.  In the pelvis, the urinary bladder is midline with normal wall thickness. There is a 3.0 x 2.2 cm left ovarian cyst, probably a dominant follicle. There is no other pelvic mass. A small amount of free fluid is noted in the cul-de-sac.  The appendix appears normal.  There is no bowel obstruction. No free air or portal venous air. There is no bowel wall or mesenteric thickening.  There is no adenopathy or abscess in the abdomen or pelvis. There is no abdominal aortic aneurysm. There are no blastic or lytic bone lesions. There is lumbar levoscoliosis.  IMPRESSION: Small amount of free fluid in cul-de-sac. Question recent ovarian cyst leakage or rupture. There is a 3 x 2.2 cm cystic area arising from the left ovary, probably a dominant follicle.  No bowel obstruction. No abscess. Appendix appears normal. No renal or ureteral calculus. No hydronephrosis.   Electronically Signed   By: Bretta Bang III M.D.   On: 12/29/2014 11:04     CBC  Recent Labs Lab 12/28/14 1520 12/29/14 0506  WBC 6.3 6.6  HGB 13.9 12.9  HCT 39.6 36.8  PLT 210 184  MCV 87.2 86.4  MCH 30.6 30.3  MCHC 35.1 35.1  RDW 12.7 12.9  LYMPHSABS 2.2 1.0  MONOABS 0.6 0.4  EOSABS 0.1 0.0  BASOSABS 0.0 0.0    Chemistries   Recent Labs Lab 12/28/14 1520 12/29/14 0506  NA 139 137  K 3.5 3.8  CL 105  105  CO2 24 24  GLUCOSE 113* 116*  BUN 6 6  CREATININE 0.89 0.82  CALCIUM 9.5 9.1  AST 20 19  ALT 11* 14  ALKPHOS 47 42  BILITOT 0.8 1.0   ------------------------------------------------------------------------------------------------------------------ estimated creatinine clearance is 90.5 mL/min (by C-G formula based on Cr of 0.82). ------------------------------------------------------------------------------------------------------------------ No results for input(s): HGBA1C in the last 72 hours. ------------------------------------------------------------------------------------------------------------------ No results for input(s): CHOL, HDL, LDLCALC, TRIG, CHOLHDL, LDLDIRECT in the last 72 hours. ------------------------------------------------------------------------------------------------------------------ No results for input(s): TSH, T4TOTAL, T3FREE, THYROIDAB in the last 72 hours.  Invalid input(s): FREET3 ------------------------------------------------------------------------------------------------------------------ No results for input(s): VITAMINB12, FOLATE, FERRITIN, TIBC, IRON, RETICCTPCT in the last 72 hours.  Coagulation profile No results  for input(s): INR, PROTIME in the last 168 hours.  No results for input(s): DDIMER in the last 72 hours.  Cardiac Enzymes No results for input(s): CKMB, TROPONINI, MYOGLOBIN in the last 168 hours.  Invalid input(s): CK ------------------------------------------------------------------------------------------------------------------ Invalid input(s): POCBNP   CBG: No results for input(s): GLUCAP in the last 168 hours.     Studies: Ct Abdomen Pelvis W Contrast  12/29/2014   CLINICAL DATA:  Abdominal pain with nausea and vomiting for 2 days  EXAM: CT ABDOMEN AND PELVIS WITH CONTRAST  TECHNIQUE: Multidetector CT imaging of the abdomen and pelvis was performed using the standard protocol following bolus administration  of intravenous contrast.  CONTRAST:  80mL OMNIPAQUE IOHEXOL 300 MG/ML  SOLN  COMPARISON:  None.  FINDINGS: Lung bases are clear.  There is a 3 mm cyst versus hamartoma at the level of the dome of the liver on the right. No other liver lesions are appreciable. The gallbladder wall is not thickened. There is no biliary duct dilatation.  Spleen, pancreas, and adrenals appear normal.  Kidneys bilaterally show no evidence of mass or hydronephrosis on either side. There is no renal or ureteral calculus on either side.  In the pelvis, the urinary bladder is midline with normal wall thickness. There is a 3.0 x 2.2 cm left ovarian cyst, probably a dominant follicle. There is no other pelvic mass. A small amount of free fluid is noted in the cul-de-sac.  The appendix appears normal.  There is no bowel obstruction. No free air or portal venous air. There is no bowel wall or mesenteric thickening.  There is no adenopathy or abscess in the abdomen or pelvis. There is no abdominal aortic aneurysm. There are no blastic or lytic bone lesions. There is lumbar levoscoliosis.  IMPRESSION: Small amount of free fluid in cul-de-sac. Question recent ovarian cyst leakage or rupture. There is a 3 x 2.2 cm cystic area arising from the left ovary, probably a dominant follicle.  No bowel obstruction. No abscess. Appendix appears normal. No renal or ureteral calculus. No hydronephrosis.   Electronically Signed   By: Bretta BangWilliam  Woodruff III M.D.   On: 12/29/2014 11:04      No results found for: HGBA1C Lab Results  Component Value Date   CREATININE 0.82 12/29/2014       Scheduled Meds: . cefTRIAXone (ROCEPHIN)  IV  1 g Intravenous Q24H   Continuous Infusions: . sodium chloride 75 mL/hr at 12/28/14 2353    Principal Problem:   Nausea with vomiting Active Problems:   UTI (lower urinary tract infection)   Abdominal pain   Nausea & vomiting    Time spent: 45 minutes   [facility] HospitalBROL,Angely Dietz  [facility] Pager [phone]. If  7PM-7AM, please contact night-coverage at [email or web address], password [email or web address] 12/29/2014, 12:12 PM  LOS: 1 day

## 2014-12-29 NOTE — Progress Notes (Signed)
Utilization Review completed. Keela Rubert RN BSN CM 

## 2014-12-29 NOTE — Progress Notes (Signed)
Patient [facility] pamph[facility] from [facility]. CM explained to patient that they may use the on site pharmacy to fill prescriptions given to them at discharge. Patient aware that [facility] will not fill narcotics or pain medications prior to the patient being seen by one of their physicians.  Patient aware that they must be seen as a patient prior to the pharmacy filling the prescriptions a second time. Either follow up appointment date and time entered into AVS

## 2014-12-29 NOTE — Hospital Discharge Follow-Up (Signed)
Received communication from Lawerance Sabalebbie Swist, RN CM that hospital follow-up appointment needed. Patient has Medicaid but does not have a PCP. Appointment obtained on 01/05/15 at 1400 with Dr. Orpah CobbAdvani.  Appointment time placed on AVS.  Lawerance Sabalebbie Swist, RN CM updated.

## 2014-12-30 LAB — GLUCOSE, CAPILLARY
GLUCOSE-CAPILLARY: 84 mg/dL (ref 65–99)
GLUCOSE-CAPILLARY: 76 mg/dL (ref 65–99)
Glucose-Capillary: 77 mg/dL (ref 65–99)

## 2014-12-30 MED ORDER — PANTOPRAZOLE SODIUM 40 MG IV SOLR
40.0000 mg | Freq: Two times a day (BID) | INTRAVENOUS | Status: DC
  Administered 2014-12-30 (×2): 40 mg via INTRAVENOUS
  Filled 2014-12-30 (×4): qty 40

## 2014-12-30 MED ORDER — POTASSIUM CHLORIDE IN NACL 20-0.9 MEQ/L-% IV SOLN
INTRAVENOUS | Status: DC
  Administered 2014-12-30 (×2): via INTRAVENOUS
  Filled 2014-12-30 (×3): qty 1000

## 2014-12-30 MED ORDER — PROMETHAZINE HCL 25 MG/ML IJ SOLN
12.5000 mg | Freq: Once | INTRAMUSCULAR | Status: AC
Start: 2014-12-30 — End: 2014-12-30
  Administered 2014-12-30: 12.5 mg via INTRAVENOUS
  Filled 2014-12-30 (×2): qty 1

## 2014-12-30 MED ORDER — SUCRALFATE 1 GM/10ML PO SUSP
1.0000 g | Freq: Three times a day (TID) | ORAL | Status: DC
  Administered 2014-12-30 – 2014-12-31 (×4): 1 g via ORAL
  Filled 2014-12-30 (×8): qty 10

## 2014-12-30 MED ORDER — SODIUM CHLORIDE 0.9 % IV SOLN
INTRAVENOUS | Status: DC
  Administered 2014-12-30: 04:00:00 via INTRAVENOUS

## 2014-12-30 NOTE — Progress Notes (Signed)
Pt continue to vomit light greenish emesis with lots of mucous. Zofran and Reglan given with no effect. New orders received and  Carried out IVF, Saline at 75 m/hr restarted Rn will continue to monitor pt .Marland KitchenMarland Kitchen

## 2014-12-30 NOTE — Progress Notes (Signed)
[facility] PROGRESS NOTE  Maria Farmer WUJ:[id number] DOB: 1995/10/29 DOA: 12/28/2014 PCP: [facility]  Assessment/Plan: Principal Problem:   Nausea with vomiting Active Problems:   UTI (lower urinary tract infection)   Abdominal pain   Nausea & vomiting    1. Nausea vomiting with abdominal pain - secondary to cannabinoid hyperemesis syndrome, less likely due to UTI.   CT scan negative for pyelonephritis, no hydronephrosis, no perirenal abscess,. Intractable nausea has delayed discharge. Follow urine culture.. Continue on ceftriaxone. Continue Phenergan, Reglan, added Carafate and Protonix to see if antireflux measures helps with the nausea. 2. Continue gentle hydration. Continue IV fluids 3. Substance abuse-counseling done   Code Status:      Code Status Orders        Start     Ordered   12/28/14 2314  Full code   Continuous     12/28/14 2314     Family Communication: family updated about patient's clinical progress Disposition Plan:  Anticipate discharge tomorrow after nausea improves   Brief narrative: Maria Farmer is a 19 y.o. female with no significant past medical history presents to the ER because of abdominal pain and nausea vomiting. Patient's symptoms started in the morning with left lower quadrant pain with multiple episodes of nausea and vomiting. Denies any diarrhea fever chills. Patient has been having some dysuria. Pain in the left lower quadrant was moving up to her left flank area. Patient's pain improved in the ER to pain relief medications. But patient still had persistent vomiting and has been admitted for further management. UA shows features consistent with UTI. Abdominal exam is benign.    Consultants:  None  Procedures:  None  Antibiotics: Anti-infectives    Start     Dose/Rate Route Frequency Ordered Stop   12/29/14 2000  cefTRIAXone (ROCEPHIN) 1 g in dextrose 5 % 50 mL IVPB - Premix     1 g 100  mL/hr over 30 Minutes Intravenous Every 24 hours 12/28/14 2323     12/28/14 2015  cefTRIAXone (ROCEPHIN) 1 g in dextrose 5 % 50 mL IVPB     1 g 100 mL/hr over 30 Minutes Intravenous  Once 12/28/14 2014 12/28/14 2056   12/28/14 2015  cefTRIAXone (ROCEPHIN) 1 G injection    Comments:  Gertie Baron   : cabinet override      12/28/14 2015 12/29/14 0829         HPI/Subjective: Continues to have persistent nausea vomiting  Objective: Filed Vitals:   12/29/14 1343 12/29/14 2023 12/29/14 2200 12/30/14 0604  BP: 130/78 131/79 136/80 133/89  Pulse: 55 53  66  Temp: 99.1 F (37.3 C) 98.9 F (37.2 C)  99.4 F (37.4 C)  TempSrc: Oral Oral  Oral  Resp: 20 15  18   Height:      Weight:      SpO2: 100% 100%  100%    Intake/Output Summary (Last 24 hours) at 12/30/14 1040 Last data filed at 12/30/14 1000  Gross per 24 hour  Intake 1488.75 ml  Output   1650 ml  Net -161.25 ml    Exam:  General: No acute respiratory distress Lungs: Clear to auscultation bilaterally without wheezes or crackles Cardiovascular: Regular rate and rhythm without murmur gallop or rub normal S1 and S2 Abdomen: Nontender, nondistended, soft, bowel sounds positive, no rebound, no ascites, no appreciable mass Extremities: No significant cyanosis, clubbing, or edema bilateral lower extremities     Data  Review   Micro Results Recent Results (from the past 240 hour(s))  Urine culture     Status: None (Preliminary result)   Collection Time: 12/28/14  2:52 PM  Result Value Ref Range Status   Specimen Description URINE, CLEAN CATCH  Final   Special Requests NONE  Final   Culture   Final    CULTURE REINCUBATED FOR BETTER GROWTH Performed at [facility]    Report Status PENDING  Incomplete    Radiology Reports Ct Abdomen Pelvis W Contrast  12/29/2014   CLINICAL DATA:  Abdominal pain with nausea and vomiting for 2 days  EXAM: CT ABDOMEN AND PELVIS WITH CONTRAST  TECHNIQUE: Multidetector CT  imaging of the abdomen and pelvis was performed using the standard protocol following bolus administration of intravenous contrast.  CONTRAST:  80mL OMNIPAQUE IOHEXOL 300 MG/ML  SOLN  COMPARISON:  None.  FINDINGS: Lung bases are clear.  There is a 3 mm cyst versus hamartoma at the level of the dome of the liver on the right. No other liver lesions are appreciable. The gallbladder wall is not thickened. There is no biliary duct dilatation.  Spleen, pancreas, and adrenals appear normal.  Kidneys bilaterally show no evidence of mass or hydronephrosis on either side. There is no renal or ureteral calculus on either side.  In the pelvis, the urinary bladder is midline with normal wall thickness. There is a 3.0 x 2.2 cm left ovarian cyst, probably a dominant follicle. There is no other pelvic mass. A small amount of free fluid is noted in the cul-de-sac.  The appendix appears normal.  There is no bowel obstruction. No free air or portal venous air. There is no bowel wall or mesenteric thickening.  There is no adenopathy or abscess in the abdomen or pelvis. There is no abdominal aortic aneurysm. There are no blastic or lytic bone lesions. There is lumbar levoscoliosis.  IMPRESSION: Small amount of free fluid in cul-de-sac. Question recent ovarian cyst leakage or rupture. There is a 3 x 2.2 cm cystic area arising from the left ovary, probably a dominant follicle.  No bowel obstruction. No abscess. Appendix appears normal. No renal or ureteral calculus. No hydronephrosis.   Electronically Signed   By: Bretta Bang III M.D.   On: 12/29/2014 11:04     CBC  Recent Labs Lab 12/28/14 1520 12/29/14 0506  WBC 6.3 6.6  HGB 13.9 12.9  HCT 39.6 36.8  PLT 210 184  MCV 87.2 86.4  MCH 30.6 30.3  MCHC 35.1 35.1  RDW 12.7 12.9  LYMPHSABS 2.2 1.0  MONOABS 0.6 0.4  EOSABS 0.1 0.0  BASOSABS 0.0 0.0    Chemistries   Recent Labs Lab 12/28/14 1520 12/29/14 0506  NA 139 137  K 3.5 3.8  CL 105 105  CO2 24 24   GLUCOSE 113* 116*  BUN 6 6  CREATININE 0.89 0.82  CALCIUM 9.5 9.1  AST 20 19  ALT 11* 14  ALKPHOS 47 42  BILITOT 0.8 1.0   ------------------------------------------------------------------------------------------------------------------ estimated creatinine clearance is 90.5 mL/min (by C-G formula based on Cr of 0.82). ------------------------------------------------------------------------------------------------------------------ No results for input(s): HGBA1C in the last 72 hours. ------------------------------------------------------------------------------------------------------------------ No results for input(s): CHOL, HDL, LDLCALC, TRIG, CHOLHDL, LDLDIRECT in the last 72 hours. ------------------------------------------------------------------------------------------------------------------ No results for input(s): TSH, T4TOTAL, T3FREE, THYROIDAB in the last 72 hours.  Invalid input(s): FREET3 ------------------------------------------------------------------------------------------------------------------ No results for input(s): VITAMINB12, FOLATE, FERRITIN, TIBC, IRON, RETICCTPCT in the last 72 hours.  Coagulation profile No results for  input(s): INR, PROTIME in the last 168 hours.  No results for input(s): DDIMER in the last 72 hours.  Cardiac Enzymes No results for input(s): CKMB, TROPONINI, MYOGLOBIN in the last 168 hours.  Invalid input(s): CK ------------------------------------------------------------------------------------------------------------------ Invalid input(s): POCBNP   CBG:  Recent Labs Lab 12/30/14 0750  GLUCAP 84       Studies: Ct Abdomen Pelvis W Contrast  12/29/2014   CLINICAL DATA:  Abdominal pain with nausea and vomiting for 2 days  EXAM: CT ABDOMEN AND PELVIS WITH CONTRAST  TECHNIQUE: Multidetector CT imaging of the abdomen and pelvis was performed using the standard protocol following bolus administration of intravenous  contrast.  CONTRAST:  80mL OMNIPAQUE IOHEXOL 300 MG/ML  SOLN  COMPARISON:  None.  FINDINGS: Lung bases are clear.  There is a 3 mm cyst versus hamartoma at the level of the dome of the liver on the right. No other liver lesions are appreciable. The gallbladder wall is not thickened. There is no biliary duct dilatation.  Spleen, pancreas, and adrenals appear normal.  Kidneys bilaterally show no evidence of mass or hydronephrosis on either side. There is no renal or ureteral calculus on either side.  In the pelvis, the urinary bladder is midline with normal wall thickness. There is a 3.0 x 2.2 cm left ovarian cyst, probably a dominant follicle. There is no other pelvic mass. A small amount of free fluid is noted in the cul-de-sac.  The appendix appears normal.  There is no bowel obstruction. No free air or portal venous air. There is no bowel wall or mesenteric thickening.  There is no adenopathy or abscess in the abdomen or pelvis. There is no abdominal aortic aneurysm. There are no blastic or lytic bone lesions. There is lumbar levoscoliosis.  IMPRESSION: Small amount of free fluid in cul-de-sac. Question recent ovarian cyst leakage or rupture. There is a 3 x 2.2 cm cystic area arising from the left ovary, probably a dominant follicle.  No bowel obstruction. No abscess. Appendix appears normal. No renal or ureteral calculus. No hydronephrosis.   Electronically Signed   By: Bretta Bang III M.D.   On: 12/29/2014 11:04      No results found for: HGBA1C Lab Results  Component Value Date   CREATININE 0.82 12/29/2014       Scheduled Meds: . cefTRIAXone (ROCEPHIN)  IV  1 g Intravenous Q24H  . pantoprazole (PROTONIX) IV  40 mg Intravenous Q12H  . sucralfate  1 g Oral TID WC & HS   Continuous Infusions: . 0.9 % NaCl with KCl 20 mEq / L 75 mL/hr at 12/30/14 1610    Principal Problem:   Nausea with vomiting Active Problems:   UTI (lower urinary tract infection)   Abdominal pain   Nausea &  vomiting    Time spent: 45 minutes   [facility]  [facility] Pager [phone]. If 7PM-7AM, please contact night-coverage at [email or web address], password [facility] 12/30/2014, 10:40 AM  LOS: 2 days

## 2014-12-30 NOTE — Progress Notes (Signed)
Patient received pamphlet from [facility] and [facility]. CM explained to patient that they may use the on site pharmacy to fill prescriptions given to them at discharge. Patient aware that the [facility] pharmacy will not fill narcotics or pain medications prior to the patient being seen by one of their physicians.  Patient aware that they must be seen as a patient prior to the pharmacy filling the prescriptions a second time. Either follow up appointment date and time entered into AVS,

## 2014-12-31 LAB — CBC
HCT: 36.4 % (ref 36.0–46.0)
Hemoglobin: 12.9 g/dL (ref 12.0–15.0)
MCH: 30.2 pg (ref 26.0–34.0)
MCHC: 35.4 g/dL (ref 30.0–36.0)
MCV: 85.2 fL (ref 78.0–100.0)
Platelets: 191 10*3/uL (ref 150–400)
RBC: 4.27 MIL/uL (ref 3.87–5.11)
RDW: 12.5 % (ref 11.5–15.5)
WBC: 4.9 10*3/uL (ref 4.0–10.5)

## 2014-12-31 LAB — COMPREHENSIVE METABOLIC PANEL
ALT: 14 U/L (ref 14–54)
ANION GAP: 9 (ref 5–15)
AST: 16 U/L (ref 15–41)
Albumin: 3.7 g/dL (ref 3.5–5.0)
Alkaline Phosphatase: 36 U/L — ABNORMAL LOW (ref 38–126)
BUN: 5 mg/dL — AB (ref 6–20)
CALCIUM: 8.9 mg/dL (ref 8.9–10.3)
CHLORIDE: 104 mmol/L (ref 101–111)
CO2: 23 mmol/L (ref 22–32)
CREATININE: 0.89 mg/dL (ref 0.44–1.00)
GFR calc non Af Amer: >60 mL/min (ref >60)
GLUCOSE: 82 mg/dL (ref 65–99)
POTASSIUM: 3.7 mmol/L (ref 3.5–5.1)
Sodium: 136 mmol/L (ref 135–145)
Total Bilirubin: 0.9 mg/dL (ref 0.3–1.2)
Total Protein: 6.6 g/dL (ref 6.5–8.1)

## 2014-12-31 LAB — URINE CULTURE: Culture: >=100000

## 2014-12-31 MED ORDER — SUCRALFATE 1 GM/10ML PO SUSP: 1.0000 g | Freq: Three times a day (TID) | ORAL | Status: DC

## 2014-12-31 MED ORDER — PROMETHAZINE HCL 12.5 MG PO TABS: 12.5000 mg | ORAL_TABLET | Freq: Four times a day (QID) | ORAL | Status: DC | PRN

## 2014-12-31 MED ORDER — PANTOPRAZOLE SODIUM 40 MG PO TBEC: 40.0000 mg | DELAYED_RELEASE_TABLET | Freq: Every day | ORAL | Status: DC

## 2014-12-31 NOTE — Care Management Note (Signed)
Case Management Note  Patient Details  Name: Awa Bachicha MRN: [id number] Date of Birth: 09-25-1995  Subjective/Objective:                    Action/Plan:   Expected Discharge Date:                  Expected Discharge Plan:  Home/Self Care  In-House Referral:     Discharge planning Services  CM Consult  Post Acute Care Choice:    Choice offered to:     DME Arranged:    DME Agency:     HH Arranged:    HH Agency:     Status of Service:  Completed, signed off  Medicare Important Message Given:    Date Medicare IM Given:    Medicare IM give by:    Date Additional Medicare IM Given:    Additional Medicare Important Message give by:     If discussed at Long Length of Stay Meetings, dates discussed:    Additional Comments:  Lawerance Sabal, RN 12/31/2014, 11:18 AM

## 2014-12-31 NOTE — Progress Notes (Signed)
Nsg Discharge Note  Admit Date:  12/28/2014 Discharge date: 12/31/2014   Maria Farmer to be D/C'd Home per MD order.  AVS completed.  Copy for chart, and copy for patient signed, and dated. Patient/caregiver able to verbalize understanding.  Discharge Medication:   Medication List    STOP taking these medications        HYDROcodone-acetaminophen 5-325 MG per tablet  Commonly known as:  NORCO/VICODIN     naproxen 500 MG tablet  Commonly known as:  NAPROSYN      TAKE these medications        albuterol 108 (90 BASE) MCG/ACT inhaler  Commonly known as:  PROVENTIL HFA;VENTOLIN HFA  Inhale 1-2 puffs into the lungs every 6 (six) hours as needed for wheezing or shortness of breath.     etonogestrel 68 MG Impl implant  Commonly known as:  NEXPLANON  Inject 1 each into the skin once.     pantoprazole 40 MG tablet  Commonly known as:  PROTONIX  Take 1 tablet (40 mg total) by mouth daily.     promethazine 12.5 MG tablet  Commonly known as:  PHENERGAN  Take 1 tablet (12.5 mg total) by mouth every 6 (six) hours as needed for nausea or vomiting.     sucralfate 1 GM/10ML suspension  Commonly known as:  CARAFATE  Take 10 mLs (1 g total) by mouth 4 (four) times daily -  with meals and at bedtime.        Discharge Assessment: Filed Vitals:   12/31/14 0527  BP: 124/87  Pulse: 54  Temp: 98.8 F (37.1 C)  Resp: 15   Skin clean, dry and intact without evidence of skin break down, no evidence of skin tears noted. IV catheter discontinued intact. Site without signs and symptoms of complications - no redness or edema noted at insertion site, patient denies c/o pain - only slight tenderness at site.  Dressing with slight pressure applied.  D/c Instructions-Education: Discharge instructions given to patient/family with verbalized understanding. D/c education completed with patient/family including follow up instructions, medication list, d/c activities limitations if indicated, with  other d/c instructions as indicated by MD - patient able to verbalize understanding, all questions fully answered. Patient instructed to return to ED, call 911, or call MD for any changes in condition.  Patient escorted via WC, and D/C home via private auto.  Kern Reap, RN 12/31/2014 11:48 AM

## 2014-12-31 NOTE — Discharge Summary (Signed)
Physician Discharge Summary  Maria Farmer MRN: [id number] DOB/AGE: 03/08/1996 19 y.o.  PCP: [facility]   Admit date: 12/28/2014 Discharge date: 12/31/2014  Discharge Diagnoses:     Principal Problem:   Nausea with vomiting Active Problems:   UTI (lower urinary tract infection)   Abdominal pain   Nausea & vomiting  cannabinoid hyperemesis syndrome   Follow-up recommendations Follow-up with PCP in 3-5 days , including although additional recommended appointments as below Follow-up CBC, CMP in 3-5 days      Medication List    STOP taking these medications        HYDROcodone-acetaminophen 5-325 MG per tablet  Commonly known as:  NORCO/VICODIN     naproxen 500 MG tablet  Commonly known as:  NAPROSYN      TAKE these medications        albuterol 108 (90 BASE) MCG/ACT inhaler  Commonly known as:  PROVENTIL HFA;VENTOLIN HFA  Inhale 1-2 puffs into the lungs every 6 (six) hours as needed for wheezing or shortness of breath.     etonogestrel 68 MG Impl implant  Commonly known as:  NEXPLANON  Inject 1 each into the skin once.     pantoprazole 40 MG tablet  Commonly known as:  PROTONIX  Take 1 tablet (40 mg total) by mouth daily.     promethazine 12.5 MG tablet  Commonly known as:  PHENERGAN  Take 1 tablet (12.5 mg total) by mouth every 6 (six) hours as needed for nausea or vomiting.     sucralfate 1 GM/10ML suspension  Commonly known as:  CARAFATE  Take 10 mLs (1 g total) by mouth 4 (four) times daily -  with meals and at bedtime.         Discharge Condition:     Disposition: 01-Home or Self Care   Consults: Stable     Significant Diagnostic Studies:  Ct Abdomen Pelvis W Contrast  12/29/2014   CLINICAL DATA:  Abdominal pain with nausea and vomiting for 2 days  EXAM: CT ABDOMEN AND PELVIS WITH CONTRAST  TECHNIQUE: Multidetector CT imaging of the abdomen and pelvis was performed using the standard protocol following bolus  administration of intravenous contrast.  CONTRAST:  33m OMNIPAQUE IOHEXOL 300 MG/ML  SOLN  COMPARISON:  None.  FINDINGS: Lung bases are clear.  There is a 3 mm cyst versus hamartoma at the level of the dome of the liver on the right. No other liver lesions are appreciable. The gallbladder wall is not thickened. There is no biliary duct dilatation.  Spleen, pancreas, and adrenals appear normal.  Kidneys bilaterally show no evidence of mass or hydronephrosis on either side. There is no renal or ureteral calculus on either side.  In the pelvis, the urinary bladder is midline with normal wall thickness. There is a 3.0 x 2.2 cm left ovarian cyst, probably a dominant follicle. There is no other pelvic mass. A small amount of free fluid is noted in the cul-de-sac.  The appendix appears normal.  There is no bowel obstruction. No free air or portal venous air. There is no bowel wall or mesenteric thickening.  There is no adenopathy or abscess in the abdomen or pelvis. There is no abdominal aortic aneurysm. There are no blastic or lytic bone lesions. There is lumbar levoscoliosis.  IMPRESSION: Small amount of free fluid in cul-de-sac. Question recent ovarian cyst leakage or rupture. There is a 3 x 2.2 cm cystic area arising from the left ovary, probably a  dominant follicle.  No bowel obstruction. No abscess. Appendix appears normal. No renal or ureteral calculus. No hydronephrosis.   Electronically Signed   By: Lowella Grip III M.D.   On: 12/29/2014 11:04        Filed Weights   12/28/14 1453  Weight: 56.7 kg (125 lb)     Microbiology: Recent Results (from the past 240 hour(s))  Urine culture     Status: None   Collection Time: 12/28/14  2:52 PM  Result Value Ref Range Status   Specimen Description URINE, CLEAN CATCH  Final   Special Requests NONE  Final   Culture   Final    >=100,000 COLONIES/mL STAPHYLOCOCCUS SPECIES (COAGULASE NEGATIVE) Performed at [facility]    Report Status  12/31/2014 FINAL  Final   Organism ID, Bacteria STAPHYLOCOCCUS SPECIES (COAGULASE NEGATIVE)  Final      Susceptibility   Staphylococcus species (coagulase negative) - MIC*    CIPROFLOXACIN <=0.5 SENSITIVE Sensitive     GENTAMICIN <=0.5 SENSITIVE Sensitive     NITROFURANTOIN <=16 SENSITIVE Sensitive     OXACILLIN 2 SENSITIVE Sensitive     TETRACYCLINE <=1 SENSITIVE Sensitive     VANCOMYCIN <=0.5 SENSITIVE Sensitive     TRIMETH/SULFA <=10 SENSITIVE Sensitive     CLINDAMYCIN <=0.25 SENSITIVE Sensitive     RIFAMPIN <=0.5 SENSITIVE Sensitive     Inducible Clindamycin NEGATIVE Sensitive     * >=100,000 COLONIES/mL STAPHYLOCOCCUS SPECIES (COAGULASE NEGATIVE)       Blood Culture    Component Value Date/Time   SDES URINE, CLEAN CATCH 12/28/2014 1452   SPECREQUEST NONE 12/28/2014 1452   CULT  12/28/2014 1452    >=100,000 COLONIES/mL STAPHYLOCOCCUS SPECIES (COAGULASE NEGATIVE) Performed at [facility] 12/31/2014 FINAL 12/28/2014 1452      Labs: Results for orders placed or performed during the hospital encounter of 12/28/14 (from the past 48 hour(s))  Glucose, capillary     Status: None   Collection Time: 12/30/14  7:50 AM  Result Value Ref Range   Glucose-Capillary 84 65 - 99 mg/dL   Comment 1 Notify RN   Glucose, capillary     Status: None   Collection Time: 12/30/14 11:04 AM  Result Value Ref Range   Glucose-Capillary 76 65 - 99 mg/dL   Comment 1 Notify RN   Glucose, capillary     Status: None   Collection Time: 12/30/14  4:47 PM  Result Value Ref Range   Glucose-Capillary 77 65 - 99 mg/dL  CBC     Status: None   Collection Time: 12/31/14  4:59 AM  Result Value Ref Range   WBC 4.9 4.0 - 10.5 K/uL   RBC 4.27 3.87 - 5.11 MIL/uL   Hemoglobin 12.9 12.0 - 15.0 g/dL   HCT 36.4 36.0 - 46.0 %   MCV 85.2 78.0 - 100.0 fL   MCH 30.2 26.0 - 34.0 pg   MCHC 35.4 30.0 - 36.0 g/dL   RDW 12.5 11.5 - 15.5 %   Platelets 191 150 - 400 K/uL  Comprehensive  metabolic panel     Status: Abnormal   Collection Time: 12/31/14  4:59 AM  Result Value Ref Range   Sodium 136 135 - 145 mmol/L   Potassium 3.7 3.5 - 5.1 mmol/L   Chloride 104 101 - 111 mmol/L   CO2 23 22 - 32 mmol/L   Glucose, Bld 82 65 - 99 mg/dL   BUN 5 (L) 6 - 20 mg/dL  Creatinine, Ser 0.89 0.44 - 1.00 mg/dL   Calcium 8.9 8.9 - 10.3 mg/dL   Total Protein 6.6 6.5 - 8.1 g/dL   Albumin 3.7 3.5 - 5.0 g/dL   AST 16 15 - 41 U/L   ALT 14 14 - 54 U/L   Alkaline Phosphatase 36 (L) 38 - 126 U/L   Total Bilirubin 0.9 0.3 - 1.2 mg/dL   GFR calc non Af Amer >60 >60 mL/min   GFR calc Af Amer >60 >60 mL/min    Comment: (NOTE) The eGFR has been calculated using the CKD EPI equation. This calculation has not been validated in all clinical situations. eGFR's persistently <60 mL/min signify possible Chronic Kidney Disease.    Anion gap 9 5 - 15     Lipid Panel  No results found for: CHOL, TRIG, HDL, CHOLHDL, VLDL, LDLCALC, LDLDIRECT   No results found for: HGBA1C   Lab Results  Component Value Date   CREATININE 0.89 12/31/2014     HPI :Maria Farmer is a 19 y.o. female with no significant past medical history presents to the ER because of abdominal pain and nausea vomiting. Patient's symptoms started in the morning with left lower quadrant pain with multiple episodes of nausea and vomiting. Denies any diarrhea fever chills. Patient has been having some dysuria. Pain in the left lower quadrant was moving up to her left flank area. Patient's pain improved in the ER to pain relief medications. But patient still had persistent vomiting and has been admitted for further management. UA shows features consistent with UTI. Abdominal exam is benign.    HOSPITAL COURSE:  1. Nausea vomiting with abdominal pain - secondary to cannabinoid hyperemesis syndrome, less likely due to UTI. CT scan negative for pyelonephritis, no hydronephrosis, no perirenal abscess,. Intractable nausea has delayed  discharge. Urine culture shows staph guaiac-negative antibiotics have been discontinued.. Prior to discharge the patient clearly stated that she was tolerating fluids as well as some solid food. She is just hesitant to eat too much at this point. She thinks that she can keep herself hydrated and has not had nausea since midnight. She has been provided with a prescription for Phenergan. Advised to discontinue Naprosyn and continue with the PPI. 2. Substance abuse-counseling done 3.   Discharge Exam:   Blood pressure 124/87, pulse 54, temperature 98.8 F (37.1 C), temperature source Oral, resp. rate 15, height '5\' 3"'  (1.6 m), weight 56.7 kg (125 lb), last menstrual period 11/28/2014, SpO2 100 %.   General: No acute respiratory distress Lungs: Clear to auscultation bilaterally without wheezes or crackles Cardiovascular: Regular rate and rhythm without murmur gallop or rub normal S1 and S2 Abdomen: Nontender, nondistended, soft, bowel sounds positive, no rebound, no ascites, no appreciable mass Extremities: No significant cyanosis, clubbing, or edema bilateral lower extremities       Discharge Instructions    Diet - low sodium heart healthy    Complete by:  As directed      Increase activity slowly    Complete by:  As directed            Follow-up Information    Please follow up.   Why:  Hospital follow-up a      Follow up On 01/05/2015.   Why:  Appointment on 01/05/15 at 2:00 pm with Dr. Annitta Needs.      Follow up with [facility] On 01/05/2015.   Why:  2:00 pm. Please arrive 15 minutes early and bring your Rx  and any pill bottles you have.   Contact information:   [place] [address] [phone]      Follow up with [facility] information:   [address] [place] [address] [phone]       Signed: Reyne Dumas 12/31/2014, 11:38 AM        Time spent >45  mins

## 2015-01-05 ENCOUNTER — Inpatient Hospital Stay: Payer: Medicaid Other | Admitting: Internal Medicine

## 2015-01-08 ENCOUNTER — Encounter (HOSPITAL_BASED_OUTPATIENT_CLINIC_OR_DEPARTMENT_OTHER): Payer: Self-pay | Admitting: *Deleted

## 2015-01-08 ENCOUNTER — Emergency Department (HOSPITAL_BASED_OUTPATIENT_CLINIC_OR_DEPARTMENT_OTHER)
Admission: EM | Admit: 2015-01-08 | Discharge: 2015-01-09 | Disposition: A | Discharge status: Home / Self Care | Payer: Medicaid Other | Attending: Emergency Medicine | Admitting: Emergency Medicine

## 2015-01-08 DIAGNOSIS — E876 Hypokalemia: Secondary | ICD-10-CM | POA: Diagnosis not present

## 2015-01-08 DIAGNOSIS — R112 Nausea with vomiting, unspecified: Secondary | ICD-10-CM | POA: Diagnosis present

## 2015-01-08 DIAGNOSIS — Z3202 Encounter for pregnancy test, result negative: Secondary | ICD-10-CM | POA: Diagnosis not present

## 2015-01-08 DIAGNOSIS — Z8659 Personal history of other mental and behavioral disorders: Secondary | ICD-10-CM | POA: Insufficient documentation

## 2015-01-08 DIAGNOSIS — Z79899 Other long term (current) drug therapy: Secondary | ICD-10-CM | POA: Diagnosis not present

## 2015-01-08 DIAGNOSIS — Z87891 Personal history of nicotine dependence: Secondary | ICD-10-CM | POA: Insufficient documentation

## 2015-01-08 DIAGNOSIS — F1210 Cannabis abuse, uncomplicated: Secondary | ICD-10-CM | POA: Diagnosis not present

## 2015-01-08 MED ORDER — SODIUM CHLORIDE 0.9 % IV SOLN
1000.0000 mL | Freq: Once | INTRAVENOUS | Status: AC
  Administered 2015-01-08: 1000 mL via INTRAVENOUS

## 2015-01-08 MED ORDER — ONDANSETRON 4 MG PO TBDP: 4.0000 mg | ORAL_TABLET | Freq: Once | ORAL | Status: AC | PRN

## 2015-01-08 NOTE — ED Notes (Addendum)
Pt with recent hospitalization for abdominal pain and n/v is here as she states that she has not been able to keep any food down and very little water since she was discharded 7/22.  Pt weighed in triage and pt became tearful when she saw that she had lost 20lbs since hospitalization

## 2015-01-08 NOTE — ED Provider Notes (Signed)
CSN: [id number]     Arrival date & time 01/08/15  2254 History  This chart was scribed for Shon Baton, MD by Budd Palmer, ED Scribe. This patient was seen in room MH01/MH01 and the patient's care was started at 12:13 AM.   Chief Complaint  Patient presents with  . Emesis   The history is provided by the patient. No language interpreter was used.   HPI Comments: Caterin Tabares is a 19 y.o. female who presents to the Emergency Department complaining of emesis onset 2 weeks ago. She states she has been unable to eat anything or keep down any medication since her last visit and has lost 20lbs. Pt reports associated abdominal pain when she vomits or eats. She notes that she has been smoking less marijuana since her last visit, but has not stopped. Her LNMP ended a few days ago. She denies a possible pregnancy. Pt denies diarrhea.  Past Medical History  Diagnosis Date  . Depression     "went to [facility] in [place]"   Past Surgical History  Procedure Laterality Date  . No past surgeries     Family History  Problem Relation Age of Onset  . Diabetes Mellitus II Maternal Grandmother    History  Substance Use Topics  . Smoking status: Former Smoker -- 0.10 packs/day for 3 years  . Smokeless tobacco: Never Used  . Alcohol Use: Yes     Comment: 12/29/2014 "might drink a beer couple times/month"   OB History    No data available     Review of Systems  Constitutional: Negative for fever.  Respiratory: Negative for cough, chest tightness and shortness of breath.   Cardiovascular: Negative for chest pain.  Gastrointestinal: Positive for nausea, vomiting and abdominal pain.  Genitourinary: Negative for dysuria.  Neurological: Negative for headaches.  All other systems reviewed and are negative.   Allergies  Peanuts  Home Medications   Prior to Admission medications   Medication Sig Start Date End Date Taking? Authorizing Provider  albuterol (PROVENTIL HFA;VENTOLIN HFA) 108  (90 BASE) MCG/ACT inhaler Inhale 1-2 puffs into the lungs every 6 (six) hours as needed for wheezing or shortness of breath. Patient not taking: Reported on 12/29/2014 08/23/14   Glynn Octave, MD  etonogestrel (IMPLANON) 68 MG IMPL implant Inject 1 each into the skin once.    Historical Provider, MD  pantoprazole (PROTONIX) 40 MG tablet Take 1 tablet (40 mg total) by mouth daily. 12/31/14   Richarda Overlie, MD  promethazine (PHENERGAN) 12.5 MG tablet Take 1 tablet (12.5 mg total) by mouth every 6 (six) hours as needed for nausea or vomiting. 12/31/14   Richarda Overlie, MD  sucralfate (CARAFATE) 1 GM/10ML suspension Take 10 mLs (1 g total) by mouth 4 (four) times daily -  with meals and at bedtime. 12/31/14   Richarda Overlie, MD   BP 98/86 mmHg  Pulse 82  Temp(Src) 98.1 F (36.7 C) (Oral)  Resp 20  Wt 105 lb 11.2 oz (47.945 kg)  SpO2 100%  LMP 12/31/2014 Physical Exam  Constitutional: She is oriented to person, place, and time. No distress.  Thin  HENT:  Head: Normocephalic and atraumatic.  Mouth/Throat: Oropharynx is clear and moist.  Eyes: Pupils are equal, round, and reactive to light.  Neck: Neck supple.  Cardiovascular: Normal rate, regular rhythm and normal heart sounds.   No murmur heard. Pulmonary/Chest: Effort normal and breath sounds normal. No respiratory distress. She has no wheezes.  Abdominal: Soft. Bowel sounds are  normal. There is no tenderness. There is no rebound.  Neurological: She is alert and oriented to person, place, and time.  Skin: Skin is warm and dry.  Psychiatric: She has a normal mood and affect.  Nursing note and vitals reviewed.   ED Course  Procedures  DIAGNOSTIC STUDIES: Oxygen Saturation is 94% on RA, low by my interpretation.    COORDINATION OF CARE: 12:17 AM - Discussed plans to order diagnostic studies. Recommended pt stop smoking marijuana. Pt advised of plan for treatment and pt agrees.  Labs Review Labs Reviewed  COMPREHENSIVE METABOLIC PANEL -  Abnormal; Notable for the following:    Potassium 3.0 (*)    Glucose, Bld 136 (*)    Alkaline Phosphatase 37 (*)    All other components within normal limits  CBC - Abnormal; Notable for the following:    Hemoglobin 15.2 (*)    MCHC 36.4 (*)    All other components within normal limits  URINALYSIS, ROUTINE W REFLEX MICROSCOPIC (NOT AT [facility]) - Abnormal; Notable for the following:    Color, Urine AMBER (*)    APPearance CLOUDY (*)    Bilirubin Urine SMALL (*)    Ketones, ur 15 (*)    Protein, ur 30 (*)    Leukocytes, UA SMALL (*)    All other components within normal limits  URINE MICROSCOPIC-ADD ON - Abnormal; Notable for the following:    Bacteria, UA MANY (*)    Crystals CA OXALATE CRYSTALS (*)    All other components within normal limits  LIPASE, BLOOD  PREGNANCY, URINE    Imaging Review No results found.   EKG Interpretation None      MDM   Final diagnoses:  Non-intractable vomiting with nausea, vomiting of unspecified type  Marijuana abuse  Hypokalemia    Patient presents with recurrent nausea, vomiting, weight loss. Previous admission for the same. At that time thought to be related to persistent marijuana use. Patient continues to endorse marijuana usage. I discussed the patient that this is likely continuing to contribute to her symptoms. On exam she has a nontender abdomen. Lab work is only notable for mild hypokalemia and 15 ketones in the urine. This likely represents mild dehydration. Patient was given potassium and fluids. She is also given IV Zofran. She was able to tolerate fluids. At this time I do not feel that she has significant evidence of dehydration and because she is tolerating fluids does not require admission.  After history, exam, and medical workup I feel the patient has been appropriately medically screened and is safe for discharge home. Pertinent diagnoses were discussed with the patient. Patient was given return precautions.  I personally  performed the services described in this documentation, which was scribed in my presence. The recorded information has been reviewed and is accurate.   Shon Baton, MD 01/09/15 [phone]

## 2015-01-08 NOTE — ED Notes (Signed)
I placed patient on monitor. 

## 2015-01-09 LAB — URINALYSIS, ROUTINE W REFLEX MICROSCOPIC
GLUCOSE, UA: NEGATIVE mg/dL
HGB URINE DIPSTICK: NEGATIVE
KETONES UR: 15 mg/dL — AB
Nitrite: NEGATIVE
PH: 6.5 (ref 5.0–8.0)
PROTEIN: 30 mg/dL — AB
Specific Gravity, Urine: 1.03 (ref 1.005–1.030)
Urobilinogen, UA: 1 mg/dL (ref 0.0–1.0)

## 2015-01-09 LAB — PREGNANCY, URINE: Preg Test, Ur: NEGATIVE

## 2015-01-09 LAB — CBC
HCT: 41.8 % (ref 36.0–46.0)
HEMOGLOBIN: 15.2 g/dL — AB (ref 12.0–15.0)
MCH: 30.2 pg (ref 26.0–34.0)
MCHC: 36.4 g/dL — ABNORMAL HIGH (ref 30.0–36.0)
MCV: 83.1 fL (ref 78.0–100.0)
PLATELETS: 221 10*3/uL (ref 150–400)
RBC: 5.03 MIL/uL (ref 3.87–5.11)
RDW: 12.4 % (ref 11.5–15.5)
WBC: 4 10*3/uL (ref 4.0–10.5)

## 2015-01-09 LAB — URINE MICROSCOPIC-ADD ON

## 2015-01-09 LAB — COMPREHENSIVE METABOLIC PANEL
ALK PHOS: 37 U/L — AB (ref 38–126)
ALT: 36 U/L (ref 14–54)
ANION GAP: 11 (ref 5–15)
AST: 26 U/L (ref 15–41)
Albumin: 4.5 g/dL (ref 3.5–5.0)
BILIRUBIN TOTAL: 0.8 mg/dL (ref 0.3–1.2)
BUN: 6 mg/dL (ref 6–20)
CHLORIDE: 101 mmol/L (ref 101–111)
CO2: 25 mmol/L (ref 22–32)
Calcium: 10 mg/dL (ref 8.9–10.3)
Creatinine, Ser: 0.87 mg/dL (ref 0.44–1.00)
GFR calc non Af Amer: >60 mL/min (ref >60)
Glucose, Bld: 136 mg/dL — ABNORMAL HIGH (ref 65–99)
POTASSIUM: 3 mmol/L — AB (ref 3.5–5.1)
Sodium: 137 mmol/L (ref 135–145)
Total Protein: 7.8 g/dL (ref 6.5–8.1)

## 2015-01-09 LAB — LIPASE, BLOOD: LIPASE: 30 U/L (ref 22–51)

## 2015-01-09 MED ORDER — ONDANSETRON HCL 4 MG/2ML IJ SOLN
4.0000 mg | Freq: Once | INTRAMUSCULAR | Status: AC
  Administered 2015-01-09: 4 mg via INTRAVENOUS
  Filled 2015-01-09: qty 2

## 2015-01-09 MED ORDER — POTASSIUM CHLORIDE 10 MEQ/100ML IV SOLN
10.0000 meq | Freq: Once | INTRAVENOUS | Status: AC
  Administered 2015-01-09: 10 meq via INTRAVENOUS
  Filled 2015-01-09: qty 100

## 2015-01-09 NOTE — Discharge Instructions (Signed)
You were seen today for nausea, vomiting, and weight loss. Your symptoms are likely related to continued marijuana use. You should continue medications at home as directed. Increase diet slowly. You should discontinue marijuana use. You will be given follow-up for gastroenterology.   Nausea and Vomiting Nausea is a sick feeling that often comes before throwing up (vomiting). Vomiting is a reflex where stomach contents come out of your mouth. Vomiting can cause severe loss of body fluids (dehydration). Children and elderly adults can become dehydrated quickly, especially if they also have diarrhea. Nausea and vomiting are symptoms of a condition or disease. It is important to find the cause of your symptoms. CAUSES   Direct irritation of the stomach lining. This irritation can result from increased acid production (gastroesophageal reflux disease), infection, food poisoning, taking certain medicines (such as nonsteroidal anti-inflammatory drugs), alcohol use, or tobacco use.  Signals from the brain.These signals could be caused by a headache, heat exposure, an inner ear disturbance, increased pressure in the brain from injury, infection, a tumor, or a concussion, pain, emotional stimulus, or metabolic problems.  An obstruction in the gastrointestinal tract (bowel obstruction).  Illnesses such as diabetes, hepatitis, gallbladder problems, appendicitis, kidney problems, cancer, sepsis, atypical symptoms of a heart attack, or eating disorders.  Medical treatments such as chemotherapy and radiation.  Receiving medicine that makes you sleep (general anesthetic) during surgery. DIAGNOSIS Your caregiver may ask for tests to be done if the problems do not improve after a few days. Tests may also be done if symptoms are severe or if the reason for the nausea and vomiting is not clear. Tests may include:  Urine tests.  Blood tests.  Stool tests.  Cultures (to look for evidence of  infection).  X-rays or other imaging studies. Test results can help your caregiver make decisions about treatment or the need for additional tests. TREATMENT You need to stay well hydrated. Drink frequently but in small amounts.You may wish to drink water, sports drinks, clear broth, or eat frozen ice pops or gelatin dessert to help stay hydrated.When you eat, eating slowly may help prevent nausea.There are also some antinausea medicines that may help prevent nausea. HOME CARE INSTRUCTIONS   Take all medicine as directed by your caregiver.  If you do not have an appetite, do not force yourself to eat. However, you must continue to drink fluids.  If you have an appetite, eat a normal diet unless your caregiver tells you differently.  Eat a variety of complex carbohydrates (rice, wheat, potatoes, bread), lean meats, yogurt, fruits, and vegetables.  Avoid high-fat foods because they are more difficult to digest.  Drink enough water and fluids to keep your urine clear or pale yellow.  If you are dehydrated, ask your caregiver for specific rehydration instructions. Signs of dehydration may include:  Severe thirst.  Dry lips and mouth.  Dizziness.  Dark urine.  Decreasing urine frequency and amount.  Confusion.  Rapid breathing or pulse. SEEK IMMEDIATE MEDICAL CARE IF:   You have blood or brown flecks (like coffee grounds) in your vomit.  You have black or bloody stools.  You have a severe headache or stiff neck.  You are confused.  You have severe abdominal pain.  You have chest pain or trouble breathing.  You do not urinate at least once every 8 hours.  You develop cold or clammy skin.  You continue to vomit for longer than 24 to 48 hours.  You have a fever. MAKE SURE YOU:  Understand these instructions.  Will watch your condition.  Will get help right away if you are not doing well or get worse. Document Released: 05/28/2005 Document Revised: 08/20/2011  Document Reviewed: 10/25/2010 [facility] Patient Information 2015 [place], [place]. This information is not intended to replace advice given to you by your health care provider. Make sure you discuss any questions you have with your health care provider.

## 2015-10-05 ENCOUNTER — Emergency Department (HOSPITAL_BASED_OUTPATIENT_CLINIC_OR_DEPARTMENT_OTHER)
Admission: EM | Admit: 2015-10-05 | Discharge: 2015-10-05 | Disposition: A | Discharge status: Home / Self Care | Payer: Medicaid Other | Attending: Emergency Medicine | Admitting: Emergency Medicine

## 2015-10-05 ENCOUNTER — Encounter (HOSPITAL_BASED_OUTPATIENT_CLINIC_OR_DEPARTMENT_OTHER): Payer: Self-pay

## 2015-10-05 DIAGNOSIS — K5900 Constipation, unspecified: Secondary | ICD-10-CM | POA: Insufficient documentation

## 2015-10-05 DIAGNOSIS — R112 Nausea with vomiting, unspecified: Secondary | ICD-10-CM | POA: Insufficient documentation

## 2015-10-05 DIAGNOSIS — F17200 Nicotine dependence, unspecified, uncomplicated: Secondary | ICD-10-CM | POA: Insufficient documentation

## 2015-10-05 DIAGNOSIS — F1220 Cannabis dependence, uncomplicated: Secondary | ICD-10-CM

## 2015-10-05 DIAGNOSIS — F329 Major depressive disorder, single episode, unspecified: Secondary | ICD-10-CM | POA: Insufficient documentation

## 2015-10-05 DIAGNOSIS — R1110 Vomiting, unspecified: Secondary | ICD-10-CM

## 2015-10-05 DIAGNOSIS — Z79899 Other long term (current) drug therapy: Secondary | ICD-10-CM | POA: Insufficient documentation

## 2015-10-05 LAB — URINALYSIS, ROUTINE W REFLEX MICROSCOPIC
Bilirubin Urine: NEGATIVE
GLUCOSE, UA: NEGATIVE mg/dL
Hgb urine dipstick: NEGATIVE
Ketones, ur: >80 mg/dL — AB
Leukocytes, UA: NEGATIVE
Nitrite: NEGATIVE
PH: 7 (ref 5.0–8.0)
PROTEIN: NEGATIVE mg/dL
Specific Gravity, Urine: 1.026 (ref 1.005–1.030)

## 2015-10-05 LAB — COMPREHENSIVE METABOLIC PANEL
ALBUMIN: 4.5 g/dL (ref 3.5–5.0)
ALT: 26 U/L (ref 14–54)
ANION GAP: 11 (ref 5–15)
AST: 37 U/L (ref 15–41)
Alkaline Phosphatase: 41 U/L (ref 38–126)
BILIRUBIN TOTAL: 1 mg/dL (ref 0.3–1.2)
BUN: 11 mg/dL (ref 6–20)
CO2: 20 mmol/L — ABNORMAL LOW (ref 22–32)
Calcium: 9.3 mg/dL (ref 8.9–10.3)
Chloride: 105 mmol/L (ref 101–111)
Creatinine, Ser: 0.85 mg/dL (ref 0.44–1.00)
GFR calc Af Amer: >60 mL/min (ref >60)
GFR calc non Af Amer: >60 mL/min (ref >60)
Glucose, Bld: 93 mg/dL (ref 65–99)
POTASSIUM: 3.1 mmol/L — AB (ref 3.5–5.1)
Sodium: 136 mmol/L (ref 135–145)
TOTAL PROTEIN: 7.7 g/dL (ref 6.5–8.1)

## 2015-10-05 LAB — PREGNANCY, URINE: Preg Test, Ur: NEGATIVE

## 2015-10-05 LAB — RAPID URINE DRUG SCREEN, HOSP PERFORMED
AMPHETAMINES: NOT DETECTED
BARBITURATES: NOT DETECTED
BENZODIAZEPINES: NOT DETECTED
Cocaine: NOT DETECTED
Opiates: POSITIVE — AB
TETRAHYDROCANNABINOL: POSITIVE — AB

## 2015-10-05 LAB — CBC
HEMATOCRIT: 38.6 % (ref 36.0–46.0)
Hemoglobin: 14.2 g/dL (ref 12.0–15.0)
MCH: 31.8 pg (ref 26.0–34.0)
MCHC: 36.8 g/dL — AB (ref 30.0–36.0)
MCV: 86.4 fL (ref 78.0–100.0)
PLATELETS: 181 10*3/uL (ref 150–400)
RBC: 4.47 MIL/uL (ref 3.87–5.11)
RDW: 11.8 % (ref 11.5–15.5)
WBC: 6 10*3/uL (ref 4.0–10.5)

## 2015-10-05 LAB — LIPASE, BLOOD: LIPASE: 22 U/L (ref 11–51)

## 2015-10-05 MED ORDER — POTASSIUM CHLORIDE CRYS ER 20 MEQ PO TBCR
40.0000 meq | EXTENDED_RELEASE_TABLET | Freq: Once | ORAL | Status: DC
  Filled 2015-10-05: qty 2

## 2015-10-05 MED ORDER — SODIUM CHLORIDE 0.9 % IV BOLUS (SEPSIS)
1000.0000 mL | Freq: Once | INTRAVENOUS | Status: AC
  Administered 2015-10-05: 1000 mL via INTRAVENOUS

## 2015-10-05 MED ORDER — LORAZEPAM 2 MG/ML IJ SOLN
1.0000 mg | Freq: Once | INTRAMUSCULAR | Status: AC
  Administered 2015-10-05: 1 mg via INTRAVENOUS
  Filled 2015-10-05: qty 1

## 2015-10-05 MED ORDER — PANTOPRAZOLE SODIUM 40 MG IV SOLR
40.0000 mg | Freq: Once | INTRAVENOUS | Status: AC
  Administered 2015-10-05: 40 mg via INTRAVENOUS
  Filled 2015-10-05: qty 40

## 2015-10-05 MED ORDER — SODIUM CHLORIDE 0.9 % IV BOLUS (SEPSIS)
1000.0000 mL | Freq: Once | INTRAVENOUS | Status: AC
Start: 2015-10-05 — End: 2015-10-05
  Administered 2015-10-05 (×2): 1000 mL via INTRAVENOUS

## 2015-10-05 MED ORDER — ONDANSETRON HCL 4 MG/2ML IJ SOLN
4.0000 mg | Freq: Once | INTRAMUSCULAR | Status: AC
  Administered 2015-10-05: 4 mg via INTRAVENOUS
  Filled 2015-10-05: qty 2

## 2015-10-05 MED ORDER — ONDANSETRON 8 MG PO TBDP: 8.0000 mg | ORAL_TABLET | Freq: Three times a day (TID) | ORAL | Status: DC | PRN

## 2015-10-05 NOTE — ED Notes (Signed)
Pt verbalizes understanding of d/c instructions and denies any further needs at this time. 

## 2015-10-05 NOTE — Discharge Instructions (Signed)
It was our pleasure to provide your ER care today - we hope that you feel better.  Please note that a recurrent abdominal pain and vomiting syndrome has been associated with recurrent marijuana use - avoid marijuana use.  Take zofran as need for nausea.  Follow up with primary care doctor in the next 1-2 days for recheck if symptoms fail to improve/resolve.  From today's lab tests, your potassium level is low (3.1) - eat plenty of fruits and vegetables, and follow up with your doctor in 1 week.   Return to ER if worse, new symptoms, persistent vomiting, severe abdominal pain, other concern.  You were given medication in the ER - no driving for the next 4 hours.    Nausea and Vomiting Nausea is a sick feeling that often comes before throwing up (vomiting). Vomiting is a reflex where stomach contents come out of your mouth. Vomiting can cause severe loss of body fluids (dehydration). Children and elderly adults can become dehydrated quickly, especially if they also have diarrhea. Nausea and vomiting are symptoms of a condition or disease. It is important to find the cause of your symptoms. CAUSES   Direct irritation of the stomach lining. This irritation can result from increased acid production (gastroesophageal reflux disease), infection, food poisoning, taking certain medicines (such as nonsteroidal anti-inflammatory drugs), alcohol use, or tobacco use.  Signals from the brain.These signals could be caused by a headache, heat exposure, an inner ear disturbance, increased pressure in the brain from injury, infection, a tumor, or a concussion, pain, emotional stimulus, or metabolic problems.  An obstruction in the gastrointestinal tract (bowel obstruction).  Illnesses such as diabetes, hepatitis, gallbladder problems, appendicitis, kidney problems, cancer, sepsis, atypical symptoms of a heart attack, or eating disorders.  Medical treatments such as chemotherapy and radiation.  Receiving  medicine that makes you sleep (general anesthetic) during surgery. DIAGNOSIS Your caregiver may ask for tests to be done if the problems do not improve after a few days. Tests may also be done if symptoms are severe or if the reason for the nausea and vomiting is not clear. Tests may include:  Urine tests.  Blood tests.  Stool tests.  Cultures (to look for evidence of infection).  X-rays or other imaging studies. Test results can help your caregiver make decisions about treatment or the need for additional tests. TREATMENT You need to stay well hydrated. Drink frequently but in small amounts.You may wish to drink water, sports drinks, clear broth, or eat frozen ice pops or gelatin dessert to help stay hydrated.When you eat, eating slowly may help prevent nausea.There are also some antinausea medicines that may help prevent nausea. HOME CARE INSTRUCTIONS   Take all medicine as directed by your caregiver.  If you do not have an appetite, do not force yourself to eat. However, you must continue to drink fluids.  If you have an appetite, eat a normal diet unless your caregiver tells you differently.  Eat a variety of complex carbohydrates (rice, wheat, potatoes, bread), lean meats, yogurt, fruits, and vegetables.  Avoid high-fat foods because they are more difficult to digest.  Drink enough water and fluids to keep your urine clear or pale yellow.  If you are dehydrated, ask your caregiver for specific rehydration instructions. Signs of dehydration may include:  Severe thirst.  Dry lips and mouth.  Dizziness.  Dark urine.  Decreasing urine frequency and amount.  Confusion.  Rapid breathing or pulse. SEEK IMMEDIATE MEDICAL CARE IF:   You have  blood or brown flecks (like coffee grounds) in your vomit.  You have black or bloody stools.  You have a severe headache or stiff neck.  You are confused.  You have severe abdominal pain.  You have chest pain or trouble  breathing.  You do not urinate at least once every 8 hours.  You develop cold or clammy skin.  You continue to vomit for longer than 24 to 48 hours.  You have a fever. MAKE SURE YOU:   Understand these instructions.  Will watch your condition.  Will get help right away if you are not doing well or get worse.   This information is not intended to replace advice given to you by your health care provider. Make sure you discuss any questions you have with your health care provider.   Document Released: 05/28/2005 Document Revised: 08/20/2011 Document Reviewed: 10/25/2010 Elsevier Interactive Patient Education 2016 [employer].   Cannabis Use Disorder Cannabis use disorder is a mental disorder. It is not one-time or occasional use of cannabis, more commonly known as marijuana. Cannabis use disorder is the continued, nonmedical use of cannabis that interferes with normal life activities or causes health problems. People with cannabis use disorder get a feeling of extreme pleasure and relaxation from cannabis use. This "high" is very rewarding and causes people to use over and over.  The mind-altering ingredient in cannabis is know as THC. THC can also interfere with motor coordination, memory, judgment, and accurate sense of space and time. These effects can last for a few days after using cannabis. Regular heavy cannabis use can cause long-lasting problems with thinking and learning. In young people, these problems may be permanent. Cannabis sometimes causes severe anxiety, paranoia, or visual hallucinations. Man-made (synthetic) cannabis-like drugs, such as "spice" and "K2," cause the same effects as THC but are much stronger. Cannabis-like drugs can cause dangerously high blood pressure and heart rate.  Cannabis use disorder usually starts in the teenage years. It can trigger the development of schizophrenia. It is somewhat more common in men than women. People who have family members with  the disorder or existing mental health issues such as depression and posttraumatic stress disorderare more likely to develop cannabis use disorder. People with cannabis use disorder are at higher risk for use of other drugs of abuse.  SIGNS AND SYMPTOMS Signs and symptoms of cannabis use disorder include:   Use of cannabis in larger amounts or over a longer period than intended.   Unsuccessful attempts to cut down or control cannabis use.   A lot of time spent obtaining, using, or recovering from the effects of cannabis.   A strong desire or urge to use cannabis (cravings).   Continued use of cannabis in spite of problems at work, school, or home because of use.   Continued use of cannabis in spite of relationship problems because of use.  Giving up or cutting down on important life activities because of cannabis use.  Use of cannabis over and over even in situations when it is physically hazardous, such as when driving a car.   Continued use of cannabis in spite of a physical problem that is likely related to use. Physical problems can include:  Chronic cough.  Bronchitis.  Emphysema.  Throat and lung cancer.  Continued use of cannabis in spite of a mental problem that is likely related to use. Mental problems can include:  Psychosis.  Anxiety.  Difficulty sleeping.  Need to use more and more cannabis  to get the same effect, or lessened effect over time with use of the same amount (tolerance).  Having withdrawal symptoms when cannabis use is stopped, or using cannabis to reduce or avoid withdrawal symptoms. Withdrawal symptoms include:  Irritability or anger.  Anxiety or restlessness.  Difficulty sleeping.  Loss of appetite or weight.  Aches and pains.  Shakiness.  Sweating.  Chills. DIAGNOSIS Cannabis use disorder is diagnosed by your health care provider. You may be asked questions about your cannabis use and how it affects your life. A physical  exam may be done. A drug screen may be done. You may be referred to a mental health professional. The diagnosis of cannabis use disorder requires at least two symptoms within 12 months. The type of cannabis use disorder you have depends on the number of symptoms you have. The type may be:  Mild. Two or three signs and symptoms.   Moderate. Four or five signs and symptoms.   Severe. Six or more signs and symptoms.  TREATMENT Treatment is usually provided by mental health professionals with training in substance use disorders. The following options are available:  Counseling or talk therapy. Talk therapy addresses the reasons you use cannabis. It also addresses ways to keep you from using again. The goals of talk therapy include:  Identifying and avoiding triggers for use.  Learning how to handle cravings.  Replacing use with healthy activities.  Support groups. Support groups provide emotional support, advice, and guidance.  Medicine. Medicine is used to treat mental health issues that trigger cannabis use or that result from it. HOME CARE INSTRUCTIONS  Take medicines only as directed by your health care provider.  Check with your health care provider before starting any new medicines.  Keep all follow-up visits as directed by your health care provider. SEEK MEDICAL CARE IF:  You are not able to take your medicines as directed.  Your symptoms get worse. SEEK IMMEDIATE MEDICAL CARE IF: You have serious thoughts about hurting yourself or others. FOR MORE INFORMATION  National Institute on Drug Abuse: http://www.price-smith.com/  Substance Abuse and Mental Health Services Administration: SkateOasis.com.pt   This information is not intended to replace advice given to you by your health care provider. Make sure you discuss any questions you have with your health care provider.   Document Released: 05/25/2000 Document Revised: 06/18/2014 Document Reviewed: 06/10/2013 Elsevier Interactive  Patient Education 2016 [employer].    Hypokalemia Hypokalemia means that the amount of potassium in the blood is lower than normal.Potassium is a chemical, called an electrolyte, that helps regulate the amount of fluid in the body. It also stimulates muscle contraction and helps nerves function properly.Most of the body's potassium is inside of cells, and only a very small amount is in the blood. Because the amount in the blood is so small, minor changes can be life-threatening. CAUSES  Antibiotics.  Diarrhea or vomiting.  Using laxatives too much, which can cause diarrhea.  Chronic kidney disease.  Water pills (diuretics).  Eating disorders (bulimia).  Low magnesium level.  Sweating a lot. SIGNS AND SYMPTOMS  Weakness.  Constipation.  Fatigue.  Muscle cramps.  Mental confusion.  Skipped heartbeats or irregular heartbeat (palpitations).  Tingling or numbness. DIAGNOSIS  Your health care provider can diagnose hypokalemia with blood tests. In addition to checking your potassium level, your health care provider may also check other lab tests. TREATMENT Hypokalemia can be treated with potassium supplements taken by mouth or adjustments in your current medicines. If your potassium  level is very low, you may need to get potassium through a vein (IV) and be monitored in the hospital. A diet high in potassium is also helpful. Foods high in potassium are:  Nuts, such as peanuts and pistachios.  Seeds, such as sunflower seeds and pumpkin seeds.  Peas, lentils, and lima beans.  Whole grain and bran cereals and breads.  Fresh fruit and vegetables, such as apricots, avocado, bananas, cantaloupe, kiwi, oranges, tomatoes, asparagus, and potatoes.  Orange and tomato juices.  Red meats.  Fruit yogurt. HOME CARE INSTRUCTIONS  Take all medicines as prescribed by your health care provider.  Maintain a healthy diet by including nutritious food, such as fruits,  vegetables, nuts, whole grains, and lean meats.  If you are taking a laxative, be sure to follow the directions on the label. SEEK MEDICAL CARE IF:  Your weakness gets worse.  You feel your heart pounding or racing.  You are vomiting or having diarrhea.  You are diabetic and having trouble keeping your blood glucose in the normal range. SEEK IMMEDIATE MEDICAL CARE IF:  You have chest pain, shortness of breath, or dizziness.  You are vomiting or having diarrhea for more than 2 days.  You faint. MAKE SURE YOU:   Understand these instructions.  Will watch your condition.  Will get help right away if you are not doing well or get worse.   This information is not intended to replace advice given to you by your health care provider. Make sure you discuss any questions you have with your health care provider.   Document Released: 05/28/2005 Document Revised: 06/18/2014 Document Reviewed: 11/28/2012 Elsevier Interactive Patient Education [employer]2016 Elsevier Inc.

## 2015-10-05 NOTE — ED Notes (Signed)
PT reports 2 day history of generalized abdominal pain, nausea, vomiting, pt crying in triage and anxious.

## 2015-10-05 NOTE — ED Notes (Signed)
Pt tolerating PO fluids

## 2015-10-05 NOTE — ED Provider Notes (Signed)
CSN: [id number]     Arrival date & time 10/05/15  1750 History  By signing my name below, I, Maria Farmer, attest that this documentation has been prepared under the direction and in the presence of Cathren LaineKevin Ludella Pranger, MD. Electronically Signed: Phillis HaggisGabriella Farmer, ED Scribe. 10/05/2015. 6:46 PM.  Chief Complaint  Patient presents with  . Abdominal Pain   Patient is a 20 y.o. female presenting with abdominal pain. The history is provided by the patient.  Abdominal Pain Pain location:  Generalized Pain quality: aching   Pain radiates to:  Does not radiate Pain severity:  Moderate Onset quality:  Sudden Duration:  2 days Timing:  Intermittent Progression:  Worsening Chronicity:  New Ineffective treatments:  None tried Associated symptoms: constipation, nausea and vomiting   Associated symptoms: no chest pain, no chills, no cough, no diarrhea, no dysuria, no fever, no hematemesis, no shortness of breath, no vaginal bleeding and no vaginal discharge   Risk factors: has not had multiple surgeries   HPI Comments: Maria Farmer is a 20 y.o. female who presents to the Emergency Department complaining of aching, generalized, intermittent abdominal pain onset 2 days ago. abd pain mid to diffuse abd, crampy, comes and goes, no constant and/or focal pain.  Emesis episodic, persists. She reports associated yellow-clear vomiting, nausea, and constipation. No abd distension.  No diarrhea. . Pt has not tried anything for her symptoms. LMP beginning of April. She denies hx of abdominal surgeries, fever, chills, diarrhea, cough, congestion, chest pain, SOB, dysuria, or back pain.      Past Medical History  Diagnosis Date  . Depression     "[facility] [employer]"   Past Surgical History  Procedure Laterality Date  . No past surgeries     Family History  Problem Relation Age of Onset  . Diabetes Mellitus II Maternal Grandmother    Social History  Substance Use Topics  . Smoking status: Current  Every Day Smoker -- 0.10 packs/day for 3 years  . Smokeless tobacco: Never Used  . Alcohol Use: Yes     Comment: 12/29/2014 "might drink a beer couple times/month"   OB History    No data available     Review of Systems  Constitutional: Negative for fever and chills.  HENT: Negative for congestion.   Respiratory: Negative for cough and shortness of breath.   Cardiovascular: Negative for chest pain.  Gastrointestinal: Positive for nausea, vomiting, abdominal pain and constipation. Negative for diarrhea and hematemesis.  Genitourinary: Negative for dysuria, flank pain, vaginal bleeding and vaginal discharge.  Musculoskeletal: Negative for back pain.  All other systems reviewed and are negative.  Allergies  Peanuts  Home Medications   Prior to Admission medications   Medication Sig Start Date End Date Taking? Authorizing Provider  albuterol (PROVENTIL HFA;VENTOLIN HFA) 108 (90 BASE) MCG/ACT inhaler Inhale 1-2 puffs into the lungs every 6 (six) hours as needed for wheezing or shortness of breath. 08/23/14  Yes Glynn OctaveStephen Rancour, MD  etonogestrel (IMPLANON) 68 MG IMPL implant Inject 1 each into the skin once.   Yes Historical Provider, MD  promethazine (PHENERGAN) 12.5 MG tablet Take 1 tablet (12.5 mg total) by mouth every 6 (six) hours as needed for nausea or vomiting. 12/31/14  Yes Richarda OverlieNayana Abrol, MD  pantoprazole (PROTONIX) 40 MG tablet Take 1 tablet (40 mg total) by mouth daily. 12/31/14   Richarda OverlieNayana Abrol, MD  sucralfate (CARAFATE) 1 GM/10ML suspension Take 10 mLs (1 g total) by mouth 4 (four) times daily -  with meals and at bedtime. 12/31/14   Richarda Overlie, MD   BP 142/112 mmHg  Pulse 98  Temp(Src) 98.3 F (36.8 C) (Oral)  Resp 16  Ht  (1.6 m)  Wt 115 lb (52.164 kg)  BMI 20.38 kg/m2  SpO2 100% Physical Exam  Constitutional: She is oriented to person, place, and time. She appears well-developed and well-nourished. No distress.  Tearful and anxious  HENT:  Mouth/Throat:  Oropharynx is clear and moist.  Eyes: Conjunctivae are normal. No scleral icterus.  Neck: Normal range of motion. Neck supple.  Cardiovascular: Normal rate, regular rhythm, normal heart sounds and intact distal pulses.  Exam reveals no gallop and no friction rub.   No murmur heard. Pulmonary/Chest: Effort normal and breath sounds normal. She has no wheezes.  Abdominal: Soft. Bowel sounds are normal. She exhibits no distension and no mass. There is no tenderness. There is no rebound and no guarding.  Genitourinary:  No cva tenderness  Musculoskeletal: Normal range of motion. She exhibits no edema.  Neurological: She is alert and oriented to person, place, and time.  Skin: Skin is warm and dry. No rash noted. She is not diaphoretic.  Psychiatric: Her behavior is normal. Her mood appears anxious.  Nursing note and vitals reviewed.   ED Course  Procedures (including critical care time) DIAGNOSTIC STUDIES: Oxygen Saturation is 100% on RA, normal by my interpretation.    COORDINATION OF CARE: 6:45 PM-Discussed treatment plan which includes IV fluids and labs with pt at bedside and pt agreed to plan.    Labs Review  Results for orders placed or performed during the hospital encounter of 10/05/15  Urinalysis, Routine w reflex microscopic (not at [facility])  Result Value Ref Range   Color, Urine YELLOW YELLOW   APPearance CLEAR CLEAR   Specific Gravity, Urine 1.026 1.005 - 1.030   pH 7.0 5.0 - 8.0   Glucose, UA NEGATIVE NEGATIVE mg/dL   Hgb urine dipstick NEGATIVE NEGATIVE   Bilirubin Urine NEGATIVE NEGATIVE   Ketones, ur >80 (A) NEGATIVE mg/dL   Protein, ur NEGATIVE NEGATIVE mg/dL   Nitrite NEGATIVE NEGATIVE   Leukocytes, UA NEGATIVE NEGATIVE  Pregnancy, urine  Result Value Ref Range   Preg Test, Ur NEGATIVE NEGATIVE  Urine rapid drug screen (hosp performed)  Result Value Ref Range   Opiates POSITIVE (A) NONE DETECTED   Cocaine NONE DETECTED NONE DETECTED   Benzodiazepines NONE  DETECTED NONE DETECTED   Amphetamines NONE DETECTED NONE DETECTED   Tetrahydrocannabinol POSITIVE (A) NONE DETECTED   Barbiturates NONE DETECTED NONE DETECTED  CBC  Result Value Ref Range   WBC 6.0 4.0 - 10.5 K/uL   RBC 4.47 3.87 - 5.11 MIL/uL   Hemoglobin 14.2 12.0 - 15.0 g/dL   HCT 16.1 09.6 - 04.5 %   MCV 86.4 78.0 - 100.0 fL   MCH 31.8 26.0 - 34.0 pg   MCHC 36.8 (H) 30.0 - 36.0 g/dL   RDW 40.9 81.1 - 91.4 %   Platelets 181 150 - 400 K/uL  Comprehensive metabolic panel  Result Value Ref Range   Sodium 136 135 - 145 mmol/L   Potassium 3.1 (L) 3.5 - 5.1 mmol/L   Chloride 105 101 - 111 mmol/L   CO2 20 (L) 22 - 32 mmol/L   Glucose, Bld 93 65 - 99 mg/dL   BUN 11 6 - 20 mg/dL   Creatinine, Ser 7.82 0.44 - 1.00 mg/dL   Calcium 9.3 8.9 - 95.6 mg/dL   Total  Protein 7.7 6.5 - 8.1 g/dL   Albumin 4.5 3.5 - 5.0 g/dL   AST 37 15 - 41 U/L   ALT 26 14 - 54 U/L   Alkaline Phosphatase 41 38 - 126 U/L   Total Bilirubin 1.0 0.3 - 1.2 mg/dL   GFR calc non Af Amer >60 >60 mL/min   GFR calc Af Amer >60 >60 mL/min   Anion gap 11 5 - 15  Lipase, blood  Result Value Ref Range   Lipase 22 11 - 51 U/L     I have personally reviewed and evaluated these lab results as part of my medical decision-making.    MDM   I personally performed the services described in this documentation, which was scribed in my presence. The recorded information has been reviewed and considered. Cathren Laine, MD  Iv ns bolus.   Pt appears anxious.  Ativan 1 mg iv. Iv ns bolus. zofran iv. protonix iv.   Recheck abd soft non tender.  Reviewed nursing notes and prior charts for additional history.   On review charts, ?hx thc associated recurrent emesis syndrome.  uds postiive today.  Tolerating po fluids. kcl po.  Pt continues to feel improved. abd soft nt.  Pt currently appears stable for d/c.      Cathren Laine, MD 10/05/15 2043

## 2016-04-12 ENCOUNTER — Encounter (HOSPITAL_BASED_OUTPATIENT_CLINIC_OR_DEPARTMENT_OTHER): Payer: Self-pay | Admitting: Emergency Medicine

## 2016-04-12 ENCOUNTER — Emergency Department (HOSPITAL_BASED_OUTPATIENT_CLINIC_OR_DEPARTMENT_OTHER)
Admission: EM | Admit: 2016-04-12 | Discharge: 2016-04-12 | Disposition: A | Discharge status: Home / Self Care | Payer: Medicaid Other | Attending: Emergency Medicine | Admitting: Emergency Medicine

## 2016-04-12 DIAGNOSIS — Z91010 Allergy to peanuts: Secondary | ICD-10-CM | POA: Insufficient documentation

## 2016-04-12 DIAGNOSIS — N939 Abnormal uterine and vaginal bleeding, unspecified: Secondary | ICD-10-CM

## 2016-04-12 DIAGNOSIS — F17200 Nicotine dependence, unspecified, uncomplicated: Secondary | ICD-10-CM | POA: Insufficient documentation

## 2016-04-12 DIAGNOSIS — N760 Acute vaginitis: Secondary | ICD-10-CM | POA: Insufficient documentation

## 2016-04-12 DIAGNOSIS — B9689 Other specified bacterial agents as the cause of diseases classified elsewhere: Secondary | ICD-10-CM

## 2016-04-12 LAB — URINALYSIS, ROUTINE W REFLEX MICROSCOPIC
Bilirubin Urine: NEGATIVE
GLUCOSE, UA: NEGATIVE mg/dL
KETONES UR: NEGATIVE mg/dL
Nitrite: NEGATIVE
PROTEIN: NEGATIVE mg/dL
Specific Gravity, Urine: 1.018 (ref 1.005–1.030)
pH: 7.5 (ref 5.0–8.0)

## 2016-04-12 LAB — CBC WITH DIFFERENTIAL/PLATELET
BASOS ABS: 0 10*3/uL (ref 0.0–0.1)
Basophils Relative: 0 %
EOS PCT: 3 %
Eosinophils Absolute: 0.1 10*3/uL (ref 0.0–0.7)
HCT: 36.9 % (ref 36.0–46.0)
Hemoglobin: 13.1 g/dL (ref 12.0–15.0)
LYMPHS PCT: 39 %
Lymphs Abs: 1.6 10*3/uL (ref 0.7–4.0)
MCH: 32 pg (ref 26.0–34.0)
MCHC: 35.5 g/dL (ref 30.0–36.0)
MCV: 90 fL (ref 78.0–100.0)
Monocytes Absolute: 0.4 10*3/uL (ref 0.1–1.0)
Monocytes Relative: 9 %
NEUTROS ABS: 2 10*3/uL (ref 1.7–7.7)
NEUTROS PCT: 49 %
PLATELETS: 181 10*3/uL (ref 150–400)
RBC: 4.1 MIL/uL (ref 3.87–5.11)
RDW: 12 % (ref 11.5–15.5)
WBC: 4.1 10*3/uL (ref 4.0–10.5)

## 2016-04-12 LAB — BASIC METABOLIC PANEL
ANION GAP: 6 (ref 5–15)
BUN: 10 mg/dL (ref 6–20)
CO2: 27 mmol/L (ref 22–32)
Calcium: 9.5 mg/dL (ref 8.9–10.3)
Chloride: 105 mmol/L (ref 101–111)
Creatinine, Ser: 0.8 mg/dL (ref 0.44–1.00)
Glucose, Bld: 98 mg/dL (ref 65–99)
POTASSIUM: 3.4 mmol/L — AB (ref 3.5–5.1)
SODIUM: 138 mmol/L (ref 135–145)

## 2016-04-12 LAB — URINE MICROSCOPIC-ADD ON

## 2016-04-12 LAB — WET PREP, GENITAL
Sperm: NONE SEEN
Trich, Wet Prep: NONE SEEN
Yeast Wet Prep HPF POC: NONE SEEN

## 2016-04-12 LAB — PREGNANCY, URINE: Preg Test, Ur: NEGATIVE

## 2016-04-12 MED ORDER — METRONIDAZOLE 500 MG PO TABS: 500.0000 mg | ORAL_TABLET | Freq: Two times a day (BID) | ORAL | 0 refills | Status: DC

## 2016-04-12 NOTE — Discharge Instructions (Signed)
Read the information below.  Use the prescribed medication as directed.  Please discuss all new medications with your pharmacist.  You may return to the Emergency Department at any time for worsening condition or any new symptoms that concern you.    °

## 2016-04-12 NOTE — ED Triage Notes (Signed)
Patient states that she had some vomiting last night and then tonight - patient has some spotting starting today

## 2016-04-12 NOTE — ED Notes (Signed)
Pt. Reports to EDP she has had abnormal vaginal bleeding after her normal period and 2 episodes of vomiting in the past 2 days.  Pt. Reports she does use birth control.  Pt. Also reports she has had some clear discharge with no odor.

## 2016-04-12 NOTE — ED Provider Notes (Signed)
[facility] Provider Note   CSN: [id number] Arrival date & time: 04/12/16  1459     History   Chief Complaint Chief Complaint  Patient presents with  . Vaginal Bleeding    HPI Maria Farmer is a 20 y.o. female.  HPI   Patient presents with vomiting once yesterday and once today and abnormal vaginal discharge and spotting today.  The spotting is vaginal, is red in color and light. Vaginal discharge is simply increased, no discoloration, discomfort, or malodor.  She vomited once yesterday and once today.  States she had a normal menstrual period last week, is on nexplanon.  Denies fevers, urinary or bowel symptoms.  No hx abdominal surgeries.  No new or changed medications.  No abnormal foods.  Sick contact only in boyfriend who has URI, not around much recently.     Past Medical History:  Diagnosis Date  . Depression    [facility] [employer]"    Patient Active Problem List   Diagnosis Date Noted  . UTI (lower urinary tract infection) 12/28/2014  . Nausea with vomiting 12/28/2014  . Abdominal pain 12/28/2014  . Nausea & vomiting 12/28/2014    Past Surgical History:  Procedure Laterality Date  . NO PAST SURGERIES      OB History    No data available       Home Medications    Prior to Admission medications   Medication Sig Start Date End Date Taking? Authorizing Provider  albuterol (PROVENTIL HFA;VENTOLIN HFA) 108 (90 BASE) MCG/ACT inhaler Inhale 1-2 puffs into the lungs every 6 (six) hours as needed for wheezing or shortness of breath. 08/23/14   Glynn OctaveStephen Rancour, MD  etonogestrel (IMPLANON) 68 MG IMPL implant Inject 1 each into the skin once.    Historical Provider, MD  metroNIDAZOLE (FLAGYL) 500 MG tablet Take 1 tablet (500 mg total) by mouth 2 (two) times daily. One po bid x 7 days 04/12/16   Trixie DredgeEmily Mckenze Slone, PA-C  ondansetron (ZOFRAN ODT) 8 MG disintegrating tablet Take 1 tablet (8 mg total) by mouth every 8 (eight) hours as needed for nausea or  vomiting. 10/05/15   Cathren LaineKevin Steinl, MD  pantoprazole (PROTONIX) 40 MG tablet Take 1 tablet (40 mg total) by mouth daily. 12/31/14   Richarda OverlieNayana Abrol, MD  promethazine (PHENERGAN) 12.5 MG tablet Take 1 tablet (12.5 mg total) by mouth every 6 (six) hours as needed for nausea or vomiting. 12/31/14   Richarda OverlieNayana Abrol, MD  sucralfate (CARAFATE) 1 GM/10ML suspension Take 10 mLs (1 g total) by mouth 4 (four) times daily -  with meals and at bedtime. 12/31/14   Richarda OverlieNayana Abrol, MD    Family History Family History  Problem Relation Age of Onset  . Diabetes Mellitus II Maternal Grandmother     Social History Social History  Substance Use Topics  . Smoking status: Current Every Day Smoker    Packs/day: 0.10    Years: 3.00  . Smokeless tobacco: Never Used  . Alcohol use Yes     Comment: 12/29/2014 "might drink a beer couple times/month"     Allergies   Peanuts [peanut oil]   Review of Systems Review of Systems  All other systems reviewed and are negative.    Physical Exam Updated Vital Signs BP 115/73 (BP Location: Left Arm)   Pulse 76   Temp 98.5 F (36.9 C) (Oral)   Resp 18   Ht 5\' 3"  (1.6 m)   Wt 52.6 kg   LMP 04/07/2016  SpO2 100%   BMI 20.55 kg/m   Physical Exam  Constitutional: She appears well-developed and well-nourished. No distress.  HENT:  Head: Normocephalic and atraumatic.  Neck: Neck supple.  Cardiovascular: Normal rate and regular rhythm.   Pulmonary/Chest: Effort normal and breath sounds normal. No respiratory distress. She has no wheezes. She has no rales.  Abdominal: Soft. She exhibits no distension. There is tenderness in the right lower quadrant. There is no rebound and no guarding.  Genitourinary: Uterus is not enlarged and not tender. Cervix exhibits no motion tenderness. Right adnexum displays no mass, no tenderness and no fullness. Left adnexum displays no mass, no tenderness and no fullness. There is bleeding in the vagina. No erythema or tenderness in the vagina.  No foreign body in the vagina. No signs of injury around the vagina. No vaginal discharge found.  Genitourinary Comments: Small amount dark red blood on exam  Neurological: She is alert.  Skin: She is not diaphoretic.  Nursing note and vitals reviewed.    ED Treatments / Results  Labs (all labs ordered are listed, but only abnormal results are displayed) Labs Reviewed  WET PREP, GENITAL - Abnormal; Notable for the following:       Result Value   Clue Cells Wet Prep HPF POC PRESENT (*)    WBC, Wet Prep HPF POC MANY (*)    All other components within normal limits  URINALYSIS, ROUTINE W REFLEX MICROSCOPIC (NOT AT [facility]) - Abnormal; Notable for the following:    APPearance CLOUDY (*)    Hgb urine dipstick LARGE (*)    Leukocytes, UA MODERATE (*)    All other components within normal limits  BASIC METABOLIC PANEL - Abnormal; Notable for the following:    Potassium 3.4 (*)    All other components within normal limits  URINE MICROSCOPIC-ADD ON - Abnormal; Notable for the following:    Squamous Epithelial / LPF 6-30 (*)    Bacteria, UA MANY (*)    All other components within normal limits  PREGNANCY, URINE  CBC WITH DIFFERENTIAL/PLATELET  RPR  HIV ANTIBODY (ROUTINE TESTING)  GC/CHLAMYDIA PROBE AMP ([place]) NOT AT [facility]    EKG  EKG Interpretation None       Radiology No results found.  Procedures Procedures (including critical care time)  Medications Ordered in ED Medications - No data to display   Initial Impression / Assessment and Plan / ED Course  I have reviewed the triage vital signs and the nursing notes.  Pertinent labs & imaging results that were available during my care of the patient were reviewed by me and considered in my medical decision making (see chart for details).  Clinical Course   Afebrile nontoxic patient with two episodes of vomiting and abnormal vaginal spotting.  Preg test negative.  UAappears contaminated with skin cells.  Labs  unremarkable.  Pelvic unremarkable, small amount of blood. Wet prep clue cells.  Treat for BV.  D/C home with gyn follow up.  Discussed result, findings, treatment, and follow up  with patient.  Pt given return precautions.  Pt verbalizes understanding and agrees with plan.       Final Clinical Impressions(s) / ED Diagnoses   Final diagnoses:  BV (bacterial vaginosis)  Abnormal vaginal bleeding    New Prescriptions New Prescriptions   METRONIDAZOLE (FLAGYL) 500 MG TABLET    Take 1 tablet (500 mg total) by mouth 2 (two) times daily. One po bid x 7 days     Trixie DredgeEmily Lane Kjos,  PA-C 04/12/16 1722    Doug Sou, MD 04/12/16 2341

## 2016-04-13 LAB — GC/CHLAMYDIA PROBE AMP (~~LOC~~) NOT AT ARMC
Chlamydia: NEGATIVE
Neisseria Gonorrhea: NEGATIVE

## 2016-04-13 LAB — HIV ANTIBODY (ROUTINE TESTING W REFLEX): HIV Screen 4th Generation wRfx: NONREACTIVE

## 2016-04-13 LAB — RPR: RPR Ser Ql: NONREACTIVE

## 2016-09-03 ENCOUNTER — Encounter (HOSPITAL_BASED_OUTPATIENT_CLINIC_OR_DEPARTMENT_OTHER): Payer: Self-pay

## 2016-09-03 ENCOUNTER — Emergency Department (HOSPITAL_BASED_OUTPATIENT_CLINIC_OR_DEPARTMENT_OTHER)
Admission: EM | Admit: 2016-09-03 | Discharge: 2016-09-03 | Disposition: A | Discharge status: Home / Self Care | Payer: Medicaid Other | Attending: Emergency Medicine | Admitting: Emergency Medicine

## 2016-09-03 DIAGNOSIS — R112 Nausea with vomiting, unspecified: Secondary | ICD-10-CM | POA: Insufficient documentation

## 2016-09-03 DIAGNOSIS — F17200 Nicotine dependence, unspecified, uncomplicated: Secondary | ICD-10-CM | POA: Insufficient documentation

## 2016-09-03 LAB — CBC
HCT: 39.5 % (ref 36.0–46.0)
HEMOGLOBIN: 14.2 g/dL (ref 12.0–15.0)
MCH: 31.8 pg (ref 26.0–34.0)
MCHC: 35.9 g/dL (ref 30.0–36.0)
MCV: 88.4 fL (ref 78.0–100.0)
PLATELETS: 200 10*3/uL (ref 150–400)
RBC: 4.47 MIL/uL (ref 3.87–5.11)
RDW: 11.9 % (ref 11.5–15.5)
WBC: 5.1 10*3/uL (ref 4.0–10.5)

## 2016-09-03 LAB — COMPREHENSIVE METABOLIC PANEL
ALK PHOS: 51 U/L (ref 38–126)
ALT: 21 U/L (ref 14–54)
ANION GAP: 7 (ref 5–15)
AST: 26 U/L (ref 15–41)
Albumin: 4.6 g/dL (ref 3.5–5.0)
BILIRUBIN TOTAL: 0.7 mg/dL (ref 0.3–1.2)
BUN: 9 mg/dL (ref 6–20)
CALCIUM: 9.7 mg/dL (ref 8.9–10.3)
CO2: 26 mmol/L (ref 22–32)
CREATININE: 0.89 mg/dL (ref 0.44–1.00)
Chloride: 105 mmol/L (ref 101–111)
GFR calc non Af Amer: >60 mL/min (ref >60)
GLUCOSE: 97 mg/dL (ref 65–99)
Potassium: 3.7 mmol/L (ref 3.5–5.1)
Sodium: 138 mmol/L (ref 135–145)
TOTAL PROTEIN: 8 g/dL (ref 6.5–8.1)

## 2016-09-03 LAB — URINALYSIS, ROUTINE W REFLEX MICROSCOPIC
BILIRUBIN URINE: NEGATIVE
Glucose, UA: NEGATIVE mg/dL
KETONES UR: NEGATIVE mg/dL
NITRITE: NEGATIVE
PROTEIN: 30 mg/dL — AB
SPECIFIC GRAVITY, URINE: 1.026 (ref 1.005–1.030)
pH: 7.5 (ref 5.0–8.0)

## 2016-09-03 LAB — URINALYSIS, MICROSCOPIC (REFLEX)

## 2016-09-03 LAB — LIPASE, BLOOD: Lipase: 17 U/L (ref 11–51)

## 2016-09-03 LAB — PREGNANCY, URINE: PREG TEST UR: NEGATIVE

## 2016-09-03 MED ORDER — ONDANSETRON HCL 4 MG/2ML IJ SOLN
4.0000 mg | Freq: Once | INTRAMUSCULAR | Status: AC
  Administered 2016-09-03: 4 mg via INTRAVENOUS

## 2016-09-03 MED ORDER — SODIUM CHLORIDE 0.9 % IV BOLUS (SEPSIS)
1000.0000 mL | Freq: Once | INTRAVENOUS | Status: AC
  Administered 2016-09-03: 1000 mL via INTRAVENOUS

## 2016-09-03 MED ORDER — ONDANSETRON 8 MG PO TBDP: 8.0000 mg | ORAL_TABLET | Freq: Three times a day (TID) | ORAL | 0 refills | Status: DC | PRN

## 2016-09-03 MED ORDER — ONDANSETRON HCL 4 MG/2ML IJ SOLN
INTRAMUSCULAR | Status: AC
  Administered 2016-09-03: 4 mg via INTRAVENOUS
  Filled 2016-09-03: qty 2

## 2016-09-03 NOTE — ED Provider Notes (Signed)
[facility] Provider Note: Lowella DellJ. Lane Jermain Curt, MD, FACEP  CSN: [id number] MRN: [id number] ARRIVAL: 09/03/16 at 2154 ROOM: MH06/MH06  By signing my name below, I, Teofilo PodMatthew P. Jamison, attest that this documentation has been prepared under the direction and in the presence of Paula LibraJohn Prophet Renwick, MD . Electronically Signed: Teofilo PodMatthew P. Jamison, ED Scribe. 09/03/2016. 11:13 PM.   CHIEF COMPLAINT  Vomiting   HISTORY OF PRESENT ILLNESS  Maria Farmer is a 21 y.o. female with complaint of 5 episodes of vomiting since noon today. Pt reports that she was having crampy moderate abdominal pain today that has since resolved. Pt was given Zofran and 1L of fluid at the ED with relief of nausea. Pt denies any diarrhea or fever.     Past Medical History:  Diagnosis Date  . Depression    [facility] [employer]"    Past Surgical History:  Procedure Laterality Date  . NO PAST SURGERIES      Family History  Problem Relation Age of Onset  . Diabetes Mellitus II Maternal Grandmother     Social History  Substance Use Topics  . Smoking status: Current Every Day Smoker    Packs/day: 0.10    Years: 3.00  . Smokeless tobacco: Never Used  . Alcohol use Yes     Comment: 12/29/2014 "might drink a beer couple times/month"    Prior to Admission medications   Medication Sig Start Date End Date Taking? Authorizing Provider  albuterol (PROVENTIL HFA;VENTOLIN HFA) 108 (90 BASE) MCG/ACT inhaler Inhale 1-2 puffs into the lungs every 6 (six) hours as needed for wheezing or shortness of breath. 08/23/14  Yes Glynn OctaveStephen Rancour, MD  etonogestrel (IMPLANON) 68 MG IMPL implant Inject 1 each into the skin once.   Yes Historical Provider, MD    Allergies Peanuts [peanut oil]   REVIEW OF SYSTEMS  Negative except as noted here or in the History of Present Illness.   PHYSICAL EXAMINATION  Initial Vital Signs Blood pressure 113/77, pulse 88, temperature 98.3 F (36.8 C), temperature source Oral, resp.  rate 18, height 5\' 3"  (1.6 m), weight 116 lb (52.6 kg), last menstrual period 08/22/2016, SpO2 100 %.  Examination General: Well-developed, well-nourished female in no acute distress; appearance consistent with age of record HENT: normocephalic; atraumatic Eyes: pupils equal, round and reactive to light; extraocular muscles intact Neck: supple Heart: regular rate and rhythm Lungs: clear to auscultation bilaterally Abdomen: soft; nondistended; nontender; no masses or hepatosplenomegaly; bowel sounds present Extremities: No deformity; full range of motion; pulses normal Neurologic: Awake, alert and oriented; motor function intact in all extremities and symmetric; no facial droop Skin: Warm and dry Psychiatric: Normal mood and affect   RESULTS  Summary of this visit's results, reviewed by myself:   EKG Interpretation  Date/Time:    Ventricular Rate:    PR Interval:    QRS Duration:   QT Interval:    QTC Calculation:   R Axis:     Text Interpretation:        Laboratory Studies: Results for orders placed or performed during the hospital encounter of 09/03/16 (from the past 24 hour(s))  Lipase, blood     Status: None   Collection Time: 09/03/16 10:00 PM  Result Value Ref Range   Lipase 17 11 - 51 U/L  Comprehensive metabolic panel     Status: None   Collection Time: 09/03/16 10:00 PM  Result Value Ref Range   Sodium 138 135 - 145 mmol/L  Potassium 3.7 3.5 - 5.1 mmol/L   Chloride 105 101 - 111 mmol/L   CO2 26 22 - 32 mmol/L   Glucose, Bld 97 65 - 99 mg/dL   BUN 9 6 - 20 mg/dL   Creatinine, Ser 4.09 0.44 - 1.00 mg/dL   Calcium 9.7 8.9 - 81.1 mg/dL   Total Protein 8.0 6.5 - 8.1 g/dL   Albumin 4.6 3.5 - 5.0 g/dL   AST 26 15 - 41 U/L   ALT 21 14 - 54 U/L   Alkaline Phosphatase 51 38 - 126 U/L   Total Bilirubin 0.7 0.3 - 1.2 mg/dL   GFR calc non Af Amer >60 >60 mL/min   GFR calc Af Amer >60 >60 mL/min   Anion gap 7 5 - 15  CBC     Status: None   Collection Time:  09/03/16 10:00 PM  Result Value Ref Range   WBC 5.1 4.0 - 10.5 K/uL   RBC 4.47 3.87 - 5.11 MIL/uL   Hemoglobin 14.2 12.0 - 15.0 g/dL   HCT 91.4 78.2 - 95.6 %   MCV 88.4 78.0 - 100.0 fL   MCH 31.8 26.0 - 34.0 pg   MCHC 35.9 30.0 - 36.0 g/dL   RDW 21.3 08.6 - 57.8 %   Platelets 200 150 - 400 K/uL  Urinalysis, Routine w reflex microscopic     Status: Abnormal   Collection Time: 09/03/16 10:00 PM  Result Value Ref Range   Color, Urine YELLOW YELLOW   APPearance CLEAR CLEAR   Specific Gravity, Urine 1.026 1.005 - 1.030   pH 7.5 5.0 - 8.0   Glucose, UA NEGATIVE NEGATIVE mg/dL   Hgb urine dipstick LARGE (A) NEGATIVE   Bilirubin Urine NEGATIVE NEGATIVE   Ketones, ur NEGATIVE NEGATIVE mg/dL   Protein, ur 30 (A) NEGATIVE mg/dL   Nitrite NEGATIVE NEGATIVE   Leukocytes, UA TRACE (A) NEGATIVE  Pregnancy, urine     Status: None   Collection Time: 09/03/16 10:00 PM  Result Value Ref Range   Preg Test, Ur NEGATIVE NEGATIVE  Urinalysis, Microscopic (reflex)     Status: Abnormal   Collection Time: 09/03/16 10:00 PM  Result Value Ref Range   RBC / HPF 0-5 0 - 5 RBC/hpf   WBC, UA 0-5 0 - 5 WBC/hpf   Bacteria, UA FEW (A) NONE SEEN   Squamous Epithelial / LPF 0-5 (A) NONE SEEN   Urine-Other LESS THAN 10 mL OF URINE SUBMITTED    Imaging Studies: No results found.  ED COURSE  Nursing notes and initial vitals signs, including pulse oximetry, reviewed.  Vitals:   09/03/16 2159 09/03/16 2200  BP: 113/77   Pulse: 88   Resp: 18   Temp: 98.3 F (36.8 C)   TempSrc: Oral   SpO2: 100%   Weight:  116 lb (52.6 kg)  Height:  5\' 3"  (1.6 m)   11:37 PM Patient drinking fluids without emesis. Nausea has resolved.  PROCEDURES    ED DIAGNOSES     ICD-9-CM ICD-10-CM   1. Nausea and vomiting in adult 787.01 R11.2     I personally performed the services described in this documentation, which was scribed in my presence. The recorded information has been reviewed and is accurate.     Paula Libra, MD 09/03/16 [phone]

## 2016-09-03 NOTE — ED Notes (Signed)
Pt able to drink ginger ale with no vomiting afterwards.

## 2016-09-03 NOTE — ED Triage Notes (Signed)
Pt c/o generalized abdominal pain and 5 episodes of emesis that started this morning, no fevers at home, no diarrhea, pt smells gassy

## 2016-09-04 ENCOUNTER — Encounter (HOSPITAL_COMMUNITY): Payer: Self-pay

## 2016-09-04 ENCOUNTER — Emergency Department (HOSPITAL_COMMUNITY)
Admission: EM | Admit: 2016-09-04 | Discharge: 2016-09-04 | Disposition: A | Discharge status: Home / Self Care | Payer: Medicaid Other | Attending: Emergency Medicine | Admitting: Emergency Medicine

## 2016-09-04 ENCOUNTER — Emergency Department (HOSPITAL_COMMUNITY): Payer: Medicaid Other

## 2016-09-04 DIAGNOSIS — Z91010 Allergy to peanuts: Secondary | ICD-10-CM | POA: Diagnosis not present

## 2016-09-04 DIAGNOSIS — R112 Nausea with vomiting, unspecified: Secondary | ICD-10-CM | POA: Insufficient documentation

## 2016-09-04 DIAGNOSIS — F17200 Nicotine dependence, unspecified, uncomplicated: Secondary | ICD-10-CM | POA: Insufficient documentation

## 2016-09-04 DIAGNOSIS — R1084 Generalized abdominal pain: Secondary | ICD-10-CM | POA: Insufficient documentation

## 2016-09-04 DIAGNOSIS — Z79899 Other long term (current) drug therapy: Secondary | ICD-10-CM | POA: Insufficient documentation

## 2016-09-04 LAB — COMPREHENSIVE METABOLIC PANEL
ALK PHOS: 45 U/L (ref 38–126)
ALT: 29 U/L (ref 14–54)
ANION GAP: 10 (ref 5–15)
AST: 34 U/L (ref 15–41)
Albumin: 4.6 g/dL (ref 3.5–5.0)
BILIRUBIN TOTAL: 1.2 mg/dL (ref 0.3–1.2)
BUN: 10 mg/dL (ref 6–20)
CALCIUM: 9.7 mg/dL (ref 8.9–10.3)
CO2: 21 mmol/L — AB (ref 22–32)
Chloride: 108 mmol/L (ref 101–111)
Creatinine, Ser: 0.88 mg/dL (ref 0.44–1.00)
Glucose, Bld: 122 mg/dL — ABNORMAL HIGH (ref 65–99)
Potassium: 3.6 mmol/L (ref 3.5–5.1)
SODIUM: 139 mmol/L (ref 135–145)
TOTAL PROTEIN: 8.1 g/dL (ref 6.5–8.1)

## 2016-09-04 LAB — URINALYSIS, ROUTINE W REFLEX MICROSCOPIC
BILIRUBIN URINE: NEGATIVE
Bacteria, UA: NONE SEEN
Glucose, UA: NEGATIVE mg/dL
HGB URINE DIPSTICK: NEGATIVE
Ketones, ur: 80 mg/dL — AB
Leukocytes, UA: NEGATIVE
NITRITE: NEGATIVE
PROTEIN: 30 mg/dL — AB
Specific Gravity, Urine: >1.046 — ABNORMAL HIGH (ref 1.005–1.030)
pH: 8 (ref 5.0–8.0)

## 2016-09-04 LAB — CBC
HCT: 37.4 % (ref 36.0–46.0)
HEMOGLOBIN: 13.4 g/dL (ref 12.0–15.0)
MCH: 31.5 pg (ref 26.0–34.0)
MCHC: 35.8 g/dL (ref 30.0–36.0)
MCV: 87.8 fL (ref 78.0–100.0)
Platelets: 190 10*3/uL (ref 150–400)
RBC: 4.26 MIL/uL (ref 3.87–5.11)
RDW: 12.3 % (ref 11.5–15.5)
WBC: 5.1 10*3/uL (ref 4.0–10.5)

## 2016-09-04 LAB — I-STAT BETA HCG BLOOD, ED (MC, WL, AP ONLY)

## 2016-09-04 LAB — LIPASE, BLOOD: Lipase: 26 U/L (ref 11–51)

## 2016-09-04 MED ORDER — PROMETHAZINE HCL 25 MG PO TABS: 25.0000 mg | ORAL_TABLET | Freq: Four times a day (QID) | ORAL | 0 refills | Status: DC | PRN

## 2016-09-04 MED ORDER — ONDANSETRON 4 MG PO TBDP
4.0000 mg | ORAL_TABLET | Freq: Once | ORAL | Status: AC | PRN
  Administered 2016-09-04: 4 mg via ORAL
  Filled 2016-09-04: qty 1

## 2016-09-04 MED ORDER — MORPHINE SULFATE (PF) 4 MG/ML IV SOLN
4.0000 mg | Freq: Once | INTRAVENOUS | Status: AC
  Administered 2016-09-04: 4 mg via INTRAVENOUS
  Filled 2016-09-04: qty 1

## 2016-09-04 MED ORDER — SODIUM CHLORIDE 0.9 % IV BOLUS (SEPSIS)
1000.0000 mL | Freq: Once | INTRAVENOUS | Status: AC
Start: 2016-09-04 — End: 2016-09-04
  Administered 2016-09-04: 1000 mL via INTRAVENOUS

## 2016-09-04 MED ORDER — IOPAMIDOL (ISOVUE-300) INJECTION 61%
INTRAVENOUS | Status: AC
  Administered 2016-09-04: 75 mL
  Filled 2016-09-04: qty 75

## 2016-09-04 MED ORDER — ONDANSETRON HCL 4 MG/2ML IJ SOLN
4.0000 mg | Freq: Once | INTRAMUSCULAR | Status: AC
  Administered 2016-09-04: 4 mg via INTRAVENOUS
  Filled 2016-09-04: qty 2

## 2016-09-04 NOTE — ED Notes (Signed)
fPatient did not get zofran Rx. Filled, stating "everything ws closed last night.

## 2016-09-04 NOTE — ED Notes (Signed)
Patient is A & O x4.  She understood AVS papers.

## 2016-09-04 NOTE — ED Provider Notes (Signed)
[facility]-EMERGENCY DEPT Provider Note   CSN: [id number] Arrival date & time: 09/04/16  7829     History   Chief Complaint Chief Complaint  Patient presents with  . Emesis  . Abdominal Pain    HPI Maria Farmer is a 21 y.o. female.  Pt presents to the ED with abdominal pain and n/v.  The pt was at [facility] yesterday for the same.  The pt did get zofran yesterday and felt better.  She was given a rx for zofran which she did not get filled.       Past Medical History:  Diagnosis Date  . Depression    "went to [facility] in [place]"    Patient Active Problem List   Diagnosis Date Noted  . UTI (lower urinary tract infection) 12/28/2014  . Nausea with vomiting 12/28/2014  . Abdominal pain 12/28/2014  . Nausea & vomiting 12/28/2014    Past Surgical History:  Procedure Laterality Date  . NO PAST SURGERIES      OB History    No data available       Home Medications    Prior to Admission medications   Medication Sig Start Date End Date Taking? Authorizing Provider  albuterol (PROVENTIL HFA;VENTOLIN HFA) 108 (90 BASE) MCG/ACT inhaler Inhale 1-2 puffs into the lungs every 6 (six) hours as needed for wheezing or shortness of breath. 08/23/14   Glynn Octave, MD  etonogestrel (IMPLANON) 68 MG IMPL implant Inject 1 each into the skin once.    Historical Provider, MD  ondansetron (ZOFRAN ODT) 8 MG disintegrating tablet Take 1 tablet (8 mg total) by mouth every 8 (eight) hours as needed for nausea or vomiting. 09/03/16   Paula Libra, MD  promethazine (PHENERGAN) 25 MG tablet Take 1 tablet (25 mg total) by mouth every 6 (six) hours as needed for nausea or vomiting. 09/04/16   Jacalyn Lefevre, MD    Family History Family History  Problem Relation Age of Onset  . Diabetes Mellitus II Maternal Grandmother     Social History Social History  Substance Use Topics  . Smoking status: Current Every Day Smoker    Packs/day: 0.10    Years: 3.00  . Smokeless tobacco:  Never Used  . Alcohol use Yes     Comment: 12/29/2014 "might drink a beer couple times/month"     Allergies   Peanuts [peanut oil]   Review of Systems Review of Systems  Gastrointestinal: Positive for abdominal pain, nausea and vomiting.  All other systems reviewed and are negative.    Physical Exam Updated Vital Signs BP 105/85 (BP Location: Left Arm)   Pulse 87   Temp 99.3 F (37.4 C) (Oral)   Resp 18   Ht 5\' 3"  (1.6 m)   Wt 114 lb (51.7 kg)   LMP 08/22/2016   SpO2 94%   BMI 20.19 kg/m   Physical Exam  Constitutional: She is oriented to person, place, and time. She appears well-developed. She appears distressed.  Vomiting on exam  HENT:  Head: Normocephalic and atraumatic.  Right Ear: External ear normal.  Left Ear: External ear normal.  Nose: Nose normal.  Mouth/Throat: Oropharynx is clear and moist.  Eyes: Conjunctivae and EOM are normal. Pupils are equal, round, and reactive to light.  Neck: Normal range of motion. Neck supple.  Cardiovascular: Normal rate, regular rhythm, normal heart sounds and intact distal pulses.   Pulmonary/Chest: Effort normal and breath sounds normal.  Abdominal: Soft. Bowel sounds are normal. There  is generalized tenderness.  Musculoskeletal: Normal range of motion.  Neurological: She is alert and oriented to person, place, and time.  Skin: Skin is warm.  Psychiatric: She has a normal mood and affect. Her behavior is normal. Judgment and thought content normal.  Nursing note and vitals reviewed.    ED Treatments / Results  Labs (all labs ordered are listed, but only abnormal results are displayed) Labs Reviewed  COMPREHENSIVE METABOLIC PANEL - Abnormal; Notable for the following:       Result Value   CO2 21 (*)    Glucose, Bld 122 (*)    All other components within normal limits  URINALYSIS, ROUTINE W REFLEX MICROSCOPIC - Abnormal; Notable for the following:    Specific Gravity, Urine >1.046 (*)    Ketones, ur 80 (*)     Protein, ur 30 (*)    Squamous Epithelial / LPF 0-5 (*)    All other components within normal limits  LIPASE, BLOOD  CBC  I-STAT BETA HCG BLOOD, ED ([facility], [facility], [facility] ONLY)    EKG  EKG Interpretation None       Radiology Ct Abdomen Pelvis W Contrast  Result Date: 09/04/2016 CLINICAL DATA:  Recurrent vomiting.  Abdominal pain. EXAM: CT ABDOMEN AND PELVIS WITH CONTRAST TECHNIQUE: Multidetector CT imaging of the abdomen and pelvis was performed using the standard protocol following bolus administration of intravenous contrast. CONTRAST:  75mL ISOVUE-300 IOPAMIDOL (ISOVUE-300) INJECTION 61% COMPARISON:  10/04/2015 FINDINGS: Lower chest: Clear lung bases. Normal heart size without pericardial or pleural effusion. Hepatobiliary: Too small to characterize high right hepatic lobe lesion is unchanged. Normal gallbladder, without biliary ductal dilatation. Pancreas: Normal, without mass or ductal dilatation. Spleen: Normal in size, without focal abnormality. Adrenals/Urinary Tract: Normal adrenal glands. Normal kidneys, without hydronephrosis. Normal urinary bladder. Stomach/Bowel: Gastric antral underdistention. The colon is nearly completely decompressed. Areas of apparent ascending and transverse colonic wall thickening are felt to be secondary. Normal terminal ileum. Normal appendix, including on image 65/series 2. Normal small bowel. Vascular/Lymphatic: Normal caliber of the aorta and branch vessels. No abdominopelvic adenopathy. Reproductive: Normal uterus and left adnexa. Dominant follicle or cyst of 2.4 cm in the right ovary. Other: No significant free fluid. Musculoskeletal: No acute osseous abnormality. Mild convex left lumbar spine curvature. IMPRESSION: 1.  No acute process in the abdomen or pelvis. 2. Relatively diffuse colonic underdistention, limiting evaluation. Electronically Signed   By: Jeronimo GreavesKyle  Talbot M.D.   On: 09/04/2016 12:51    Procedures Procedures (including critical care  time)  Medications Ordered in ED Medications  ondansetron (ZOFRAN-ODT) disintegrating tablet 4 mg (4 mg Oral Given 09/04/16 1017)  sodium chloride 0.9 % bolus 1,000 mL (0 mLs Intravenous Stopped 09/04/16 1337)  ondansetron (ZOFRAN) injection 4 mg (4 mg Intravenous Given 09/04/16 1209)  morphine 4 MG/ML injection 4 mg (4 mg Intravenous Given 09/04/16 1210)  iopamidol (ISOVUE-300) 61 % injection (75 mLs  Contrast Given 09/04/16 1230)     Initial Impression / Assessment and Plan / ED Course  I have reviewed the triage vital signs and the nursing notes.  Pertinent labs & imaging results that were available during my care of the patient were reviewed by me and considered in my medical decision making (see chart for details).     Pt feels much better.  She is able to tolerate po fluids.  She does not have medical insurance, so I wrote a rx for phenergan as this is cheaper.  Pt knows to return if worse and  to f/u with pcp.  Final Clinical Impressions(s) / ED Diagnoses   Final diagnoses:  Generalized abdominal pain  Non-intractable vomiting with nausea, unspecified vomiting type    New Prescriptions New Prescriptions   PROMETHAZINE (PHENERGAN) 25 MG TABLET    Take 1 tablet (25 mg total) by mouth every 6 (six) hours as needed for nausea or vomiting.     Jacalyn Lefevre, MD 09/04/16 [phone]

## 2016-09-04 NOTE — ED Triage Notes (Signed)
Per EMS, pt here from home.  Pt seen at med center last night.  Given zofran.  Pt has had abdominal pain, n/v x 24 hours.  4mg  zofran in IV.  IV had to be d/c post push.  Vitals:  132/88, hr 124, resp 28, cbg 154

## 2016-09-05 ENCOUNTER — Emergency Department (HOSPITAL_BASED_OUTPATIENT_CLINIC_OR_DEPARTMENT_OTHER)
Admission: EM | Admit: 2016-09-05 | Discharge: 2016-09-05 | Disposition: A | Discharge status: Home / Self Care | Payer: Medicaid Other | Attending: Emergency Medicine | Admitting: Emergency Medicine

## 2016-09-05 ENCOUNTER — Encounter (HOSPITAL_BASED_OUTPATIENT_CLINIC_OR_DEPARTMENT_OTHER): Payer: Self-pay

## 2016-09-05 DIAGNOSIS — F17200 Nicotine dependence, unspecified, uncomplicated: Secondary | ICD-10-CM | POA: Diagnosis not present

## 2016-09-05 DIAGNOSIS — G43A0 Cyclical vomiting, not intractable: Secondary | ICD-10-CM | POA: Diagnosis not present

## 2016-09-05 DIAGNOSIS — R112 Nausea with vomiting, unspecified: Secondary | ICD-10-CM | POA: Diagnosis present

## 2016-09-05 LAB — CBC WITH DIFFERENTIAL/PLATELET
Basophils Absolute: 0 10*3/uL (ref 0.0–0.1)
Basophils Relative: 0 %
EOS PCT: 0 %
Eosinophils Absolute: 0 10*3/uL (ref 0.0–0.7)
HEMATOCRIT: 38.1 % (ref 36.0–46.0)
Hemoglobin: 13.7 g/dL (ref 12.0–15.0)
LYMPHS ABS: 1.5 10*3/uL (ref 0.7–4.0)
Lymphocytes Relative: 30 %
MCH: 31.9 pg (ref 26.0–34.0)
MCHC: 36 g/dL (ref 30.0–36.0)
MCV: 88.8 fL (ref 78.0–100.0)
Monocytes Absolute: 0.4 10*3/uL (ref 0.1–1.0)
Monocytes Relative: 8 %
Neutro Abs: 2.9 10*3/uL (ref 1.7–7.7)
Neutrophils Relative %: 62 %
PLATELETS: 203 10*3/uL (ref 150–400)
RBC: 4.29 MIL/uL (ref 3.87–5.11)
RDW: 11.8 % (ref 11.5–15.5)
WBC: 4.8 10*3/uL (ref 4.0–10.5)

## 2016-09-05 LAB — COMPREHENSIVE METABOLIC PANEL
ALK PHOS: 46 U/L (ref 38–126)
ALT: 31 U/L (ref 14–54)
AST: 38 U/L (ref 15–41)
Albumin: 4.6 g/dL (ref 3.5–5.0)
Anion gap: 10 (ref 5–15)
BILIRUBIN TOTAL: 1 mg/dL (ref 0.3–1.2)
BUN: 10 mg/dL (ref 6–20)
CHLORIDE: 105 mmol/L (ref 101–111)
CO2: 22 mmol/L (ref 22–32)
CREATININE: 0.95 mg/dL (ref 0.44–1.00)
Calcium: 9.5 mg/dL (ref 8.9–10.3)
GFR calc Af Amer: >60 mL/min (ref >60)
Glucose, Bld: 92 mg/dL (ref 65–99)
Potassium: 3.4 mmol/L — ABNORMAL LOW (ref 3.5–5.1)
Sodium: 137 mmol/L (ref 135–145)
Total Protein: 7.8 g/dL (ref 6.5–8.1)

## 2016-09-05 LAB — LIPASE, BLOOD: Lipase: 20 U/L (ref 11–51)

## 2016-09-05 MED ORDER — METOCLOPRAMIDE HCL 5 MG/ML IJ SOLN
10.0000 mg | Freq: Once | INTRAMUSCULAR | Status: AC
  Administered 2016-09-05: 10 mg via INTRAVENOUS

## 2016-09-05 MED ORDER — CAPSAICIN 0.075 % EX CREA
TOPICAL_CREAM | Freq: Two times a day (BID) | CUTANEOUS | Status: DC
  Administered 2016-09-05: 14:00:00

## 2016-09-05 MED ORDER — CAPSAICIN 0.025 % EX CREA
TOPICAL_CREAM | Freq: Once | CUTANEOUS | Status: DC
  Filled 2016-09-05: qty 60

## 2016-09-05 MED ORDER — PROMETHAZINE HCL 25 MG RE SUPP: 25.0000 mg | Freq: Four times a day (QID) | RECTAL | 0 refills | Status: DC | PRN

## 2016-09-05 MED ORDER — ONDANSETRON HCL 4 MG/2ML IJ SOLN
4.0000 mg | Freq: Once | INTRAMUSCULAR | Status: AC
  Administered 2016-09-05: 4 mg via INTRAVENOUS
  Filled 2016-09-05: qty 2

## 2016-09-05 MED ORDER — DIPHENHYDRAMINE HCL 50 MG/ML IJ SOLN
12.5000 mg | Freq: Once | INTRAMUSCULAR | Status: AC
  Administered 2016-09-05: 12.5 mg via INTRAVENOUS
  Filled 2016-09-05: qty 1

## 2016-09-05 MED ORDER — CAPSAICIN 0.075 % EX CREA
TOPICAL_CREAM | CUTANEOUS | Status: AC
  Filled 2016-09-05: qty 60

## 2016-09-05 MED ORDER — METOCLOPRAMIDE HCL 5 MG/ML IJ SOLN
INTRAMUSCULAR | Status: AC
  Administered 2016-09-05: 10 mg via INTRAVENOUS
  Filled 2016-09-05: qty 2

## 2016-09-05 MED ORDER — HALOPERIDOL LACTATE 5 MG/ML IJ SOLN
2.0000 mg | Freq: Once | INTRAMUSCULAR | Status: DC
  Filled 2016-09-05: qty 1

## 2016-09-05 NOTE — Discharge Instructions (Signed)
Take Phenergan suppositories as prescribed. Try capsaicin cream to abdomen 3-4 times a day. Follow-up with family doctor. Return if worsening.

## 2016-09-05 NOTE — ED Notes (Signed)
Patient denies pain and is resting comfortably.  

## 2016-09-05 NOTE — ED Notes (Signed)
Pt unable to give UA at this time 

## 2016-09-05 NOTE — ED Triage Notes (Signed)
c/o abd pain, n/v x 3 days-has been seen in ED x 2 for same

## 2016-09-05 NOTE — ED Notes (Signed)
Given gingerale for fluid challenge  

## 2016-09-05 NOTE — ED Provider Notes (Signed)
[facility]   CSN: [id number] Arrival date & time: 09/05/16  1239     History   Chief Complaint Chief Complaint  Patient presents with  . Abdominal Pain    HPI Maria Farmer is a 21 y.o. female.  HPI Maria Farmer is a 21 y.o. female with history of recurrent nausea and vomiting, presents to emergency department complaining of abdominal pain, nausea, vomiting. Patient states symptoms started 3 days ago. This is her third visit to the emergency department in the last 3 days for the same. She states she has tried Zofran and Phenergan with no relief. States each time she comes to the ER, she feels better, so she gets home she starts vomiting again. She states her abdominal pain is sharp and crampy, all over her abdomen. Denies any urinary symptoms. No vaginal discharge or bleeding. Denies being pregnant. Denies any prior surgeries. Patient does smoke marijuana daily. She states that she has had same symptoms in the past and states she went to gastroenterologist who told everything was normal and that they believe her symptoms were due to frequent marijuana use.  Past Medical History:  Diagnosis Date  . Depression    "went [facility] [place]"    Patient Active Problem List   Diagnosis Date Noted  . UTI (lower urinary tract infection) 12/28/2014  . Nausea with vomiting 12/28/2014  . Abdominal pain 12/28/2014  . Nausea & vomiting 12/28/2014    Past Surgical History:  Procedure Laterality Date  . NO PAST SURGERIES      OB History    No data available       Home Medications    Prior to Admission medications   Medication Sig Start Date End Date Taking? Authorizing Provider  albuterol (PROVENTIL HFA;VENTOLIN HFA) 108 (90 BASE) MCG/ACT inhaler Inhale 1-2 puffs into the lungs every 6 (six) hours as needed for wheezing or shortness of breath. 08/23/14   Glynn OctaveStephen Rancour, MD  etonogestrel (IMPLANON) 68 MG IMPL implant Inject 1 each into the skin once.     Historical Provider, MD  ondansetron (ZOFRAN ODT) 8 MG disintegrating tablet Take 1 tablet (8 mg total) by mouth every 8 (eight) hours as needed for nausea or vomiting. 09/03/16   Paula LibraJohn Molpus, MD  promethazine (PHENERGAN) 25 MG tablet Take 1 tablet (25 mg total) by mouth every 6 (six) hours as needed for nausea or vomiting. 09/04/16   Jacalyn LefevreJulie Haviland, MD    Family History Family History  Problem Relation Age of Onset  . Diabetes Mellitus II Maternal Grandmother     Social History Social History  Substance Use Topics  . Smoking status: Current Every Day Smoker    Packs/day: 0.10    Years: 3.00  . Smokeless tobacco: Never Used  . Alcohol use Yes     Comment: 12/29/2014 "might drink a beer couple times/month"     Allergies   Peanuts [peanut oil]   Review of Systems Review of Systems  Constitutional: Negative for chills and fever.  Respiratory: Negative for cough, chest tightness and shortness of breath.   Cardiovascular: Negative for chest pain, palpitations and leg swelling.  Gastrointestinal: Positive for abdominal pain, nausea and vomiting. Negative for diarrhea.  Genitourinary: Negative for dysuria, flank pain, pelvic pain, vaginal bleeding, vaginal discharge and vaginal pain.  Musculoskeletal: Negative for arthralgias, myalgias, neck pain and neck stiffness.  Skin: Negative for rash.  Neurological: Negative for dizziness, weakness and headaches.  All other systems reviewed and are  negative.    Physical Exam Updated Vital Signs BP 108/69 (BP Location: Right Arm)   Pulse 64   Temp 98.6 F (37 C) (Oral)   Resp 20   Ht 5\' 3"  (1.6 m)   Wt 51.7 kg   LMP 08/22/2016   SpO2 100%   BMI 20.19 kg/m   Physical Exam  Constitutional: She appears well-developed and well-nourished. No distress.  HENT:  Head: Normocephalic.  Eyes: Conjunctivae are normal.  Neck: Neck supple.  Cardiovascular: Normal rate, regular rhythm and normal heart sounds.   Pulmonary/Chest: Effort  normal and breath sounds normal. No respiratory distress. She has no wheezes. She has no rales.  Abdominal: Soft. Bowel sounds are normal. She exhibits no distension. There is tenderness. There is no rebound and no guarding.  Diffuse tenderness  Musculoskeletal: She exhibits no edema.  Neurological: She is alert.  Skin: Skin is warm and dry.  Psychiatric: She has a normal mood and affect. Her behavior is normal.  Nursing note and vitals reviewed.    ED Treatments / Results  Labs (all labs ordered are listed, but only abnormal results are displayed) Labs Reviewed  COMPREHENSIVE METABOLIC PANEL - Abnormal; Notable for the following:       Result Value   Potassium 3.4 (*)    All other components within normal limits  CBC WITH DIFFERENTIAL/PLATELET  LIPASE, BLOOD  URINALYSIS, ROUTINE W REFLEX MICROSCOPIC  PREGNANCY, URINE    EKG  EKG Interpretation None       Radiology Ct Abdomen Pelvis W Contrast  Result Date: 09/04/2016 CLINICAL DATA:  Recurrent vomiting.  Abdominal pain. EXAM: CT ABDOMEN AND PELVIS WITH CONTRAST TECHNIQUE: Multidetector CT imaging of the abdomen and pelvis was performed using the standard protocol following bolus administration of intravenous contrast. CONTRAST:  75mL ISOVUE-300 IOPAMIDOL (ISOVUE-300) INJECTION 61% COMPARISON:  10/04/2015 FINDINGS: Lower chest: Clear lung bases. Normal heart size without pericardial or pleural effusion. Hepatobiliary: Too small to characterize high right hepatic lobe lesion is unchanged. Normal gallbladder, without biliary ductal dilatation. Pancreas: Normal, without mass or ductal dilatation. Spleen: Normal in size, without focal abnormality. Adrenals/Urinary Tract: Normal adrenal glands. Normal kidneys, without hydronephrosis. Normal urinary bladder. Stomach/Bowel: Gastric antral underdistention. The colon is nearly completely decompressed. Areas of apparent ascending and transverse colonic wall thickening are felt to be  secondary. Normal terminal ileum. Normal appendix, including on image 65/series 2. Normal small bowel. Vascular/Lymphatic: Normal caliber of the aorta and branch vessels. No abdominopelvic adenopathy. Reproductive: Normal uterus and left adnexa. Dominant follicle or cyst of 2.4 cm in the right ovary. Other: No significant free fluid. Musculoskeletal: No acute osseous abnormality. Mild convex left lumbar spine curvature. IMPRESSION: 1.  No acute process in the abdomen or pelvis. 2. Relatively diffuse colonic underdistention, limiting evaluation. Electronically Signed   By: Jeronimo Greaves M.D.   On: 09/04/2016 12:51    Procedures Procedures (including critical care time)  Medications Ordered in ED Medications  capsicum (ZOSTRIX) 0.075 % cream ( Other Given 09/05/16 1409)  haloperidol lactate (HALDOL) injection 2 mg (not administered)  metoCLOPramide (REGLAN) injection 10 mg (not administered)  diphenhydrAMINE (BENADRYL) injection 12.5 mg (not administered)  ondansetron (ZOFRAN) injection 4 mg (4 mg Intravenous Given 09/05/16 1410)     Initial Impression / Assessment and Plan / ED Course  I have reviewed the triage vital signs and the nursing notes.  Pertinent labs & imaging results that were available during my care of the patient were reviewed by me and considered in my  medical decision making (see chart for details).    Patient to the emergency department with intractable nausea and vomiting. This is a recurrent issue for patient. Patient has had extensive evaluation of past an admission for the same. It is thought that her symptoms are due to marijuana use. Patient had negative pregnancy test yesterday and the day before yesterday. He did not think we need to repeat that. We'll repeat blood work to see if there is any worsening and electrolytes are blood cell counts. Will administer IV fluids and anti-medics.   No relief with Zofran. Applied capsaicin cream to the abdomen, which has helped the  pain significantly. She still had just a slight nausea for which we will try Reglan. The patient states she feels much better.   4:27 PM Patient symptoms resolved. She is able to drink ginger ale and eating crackers. We'll discharge her home with Phenergan suppositories and capsaicin cream. Discussed sessation of marijuana use. Return precautions discussed.   Vitals:   09/05/16 1253 09/05/16 1502  BP: 108/69 106/65  Pulse: 64 (!) 58  Resp: 20 18  Temp: 98.6 F (37 C)   TempSrc: Oral   SpO2: 100% 100%  Weight: 51.7 kg   Height: 5\' 3"  (1.6 m)      Final Clinical Impressions(s) / ED Diagnoses   Final diagnoses:  Cyclical vomiting with nausea, intractability of vomiting not specified    New Prescriptions New Prescriptions   PROMETHAZINE (PHENERGAN) 25 MG SUPPOSITORY    Place 1 suppository (25 mg total) rectally every 6 (six) hours as needed for nausea or vomiting.     Jaynie Crumble, PA-C 09/05/16 1636    Doug Sou, MD 09/05/16 [phone]

## 2016-09-13 ENCOUNTER — Emergency Department (HOSPITAL_BASED_OUTPATIENT_CLINIC_OR_DEPARTMENT_OTHER)
Admission: EM | Admit: 2016-09-13 | Discharge: 2016-09-13 | Disposition: A | Discharge status: Home / Self Care | Payer: Medicaid Other | Attending: Physician Assistant | Admitting: Physician Assistant

## 2016-09-13 ENCOUNTER — Encounter (HOSPITAL_BASED_OUTPATIENT_CLINIC_OR_DEPARTMENT_OTHER): Payer: Self-pay | Admitting: Emergency Medicine

## 2016-09-13 DIAGNOSIS — R112 Nausea with vomiting, unspecified: Secondary | ICD-10-CM | POA: Insufficient documentation

## 2016-09-13 DIAGNOSIS — Z87891 Personal history of nicotine dependence: Secondary | ICD-10-CM | POA: Insufficient documentation

## 2016-09-13 DIAGNOSIS — R1084 Generalized abdominal pain: Secondary | ICD-10-CM

## 2016-09-13 DIAGNOSIS — Z79899 Other long term (current) drug therapy: Secondary | ICD-10-CM | POA: Insufficient documentation

## 2016-09-13 DIAGNOSIS — F1290 Cannabis use, unspecified, uncomplicated: Secondary | ICD-10-CM | POA: Diagnosis not present

## 2016-09-13 LAB — RAPID URINE DRUG SCREEN, HOSP PERFORMED
Amphetamines: NOT DETECTED
Barbiturates: NOT DETECTED
Benzodiazepines: NOT DETECTED
COCAINE: NOT DETECTED
OPIATES: NOT DETECTED
Tetrahydrocannabinol: POSITIVE — AB

## 2016-09-13 LAB — URINALYSIS, ROUTINE W REFLEX MICROSCOPIC
BILIRUBIN URINE: NEGATIVE
Glucose, UA: NEGATIVE mg/dL
Hgb urine dipstick: NEGATIVE
KETONES UR: 40 mg/dL — AB
NITRITE: NEGATIVE
Protein, ur: 100 mg/dL — AB
Specific Gravity, Urine: 1.021 (ref 1.005–1.030)
pH: 6.5 (ref 5.0–8.0)

## 2016-09-13 LAB — CBC WITH DIFFERENTIAL/PLATELET
BASOS ABS: 0 10*3/uL (ref 0.0–0.1)
Basophils Relative: 0 %
EOS ABS: 0 10*3/uL (ref 0.0–0.7)
Eosinophils Relative: 0 %
HCT: 41.7 % (ref 36.0–46.0)
Hemoglobin: 15.1 g/dL — ABNORMAL HIGH (ref 12.0–15.0)
LYMPHS ABS: 1.5 10*3/uL (ref 0.7–4.0)
LYMPHS PCT: 21 %
MCH: 31.9 pg (ref 26.0–34.0)
MCHC: 36.2 g/dL — AB (ref 30.0–36.0)
MCV: 88.2 fL (ref 78.0–100.0)
Monocytes Absolute: 0.9 10*3/uL (ref 0.1–1.0)
Monocytes Relative: 12 %
Neutro Abs: 4.7 10*3/uL (ref 1.7–7.7)
Neutrophils Relative %: 67 %
PLATELETS: 258 10*3/uL (ref 150–400)
RBC: 4.73 MIL/uL (ref 3.87–5.11)
RDW: 12.3 % (ref 11.5–15.5)
WBC: 7.1 10*3/uL (ref 4.0–10.5)

## 2016-09-13 LAB — COMPREHENSIVE METABOLIC PANEL
ALBUMIN: 4.8 g/dL (ref 3.5–5.0)
ALK PHOS: 44 U/L (ref 38–126)
ALT: 28 U/L (ref 14–54)
ANION GAP: 13 (ref 5–15)
AST: 42 U/L — ABNORMAL HIGH (ref 15–41)
BILIRUBIN TOTAL: 0.7 mg/dL (ref 0.3–1.2)
BUN: 11 mg/dL (ref 6–20)
CALCIUM: 9.9 mg/dL (ref 8.9–10.3)
CO2: 25 mmol/L (ref 22–32)
Chloride: 100 mmol/L — ABNORMAL LOW (ref 101–111)
Creatinine, Ser: 0.95 mg/dL (ref 0.44–1.00)
GFR calc Af Amer: >60 mL/min (ref >60)
GFR calc non Af Amer: >60 mL/min (ref >60)
Glucose, Bld: 108 mg/dL — ABNORMAL HIGH (ref 65–99)
Potassium: 3.8 mmol/L (ref 3.5–5.1)
Sodium: 138 mmol/L (ref 135–145)
TOTAL PROTEIN: 8.5 g/dL — AB (ref 6.5–8.1)

## 2016-09-13 LAB — URINALYSIS, MICROSCOPIC (REFLEX)

## 2016-09-13 LAB — PREGNANCY, URINE: Preg Test, Ur: NEGATIVE

## 2016-09-13 MED ORDER — HALOPERIDOL LACTATE 5 MG/ML IJ SOLN: 2.0000 mg | Freq: Once | INTRAMUSCULAR | Status: DC

## 2016-09-13 MED ORDER — CAPSAICIN 0.075 % EX CREA
TOPICAL_CREAM | Freq: Once | CUTANEOUS | Status: AC
  Administered 2016-09-13: 1 via TOPICAL

## 2016-09-13 MED ORDER — METOCLOPRAMIDE HCL 5 MG/ML IJ SOLN
10.0000 mg | Freq: Once | INTRAMUSCULAR | Status: AC
  Administered 2016-09-13: 10 mg via INTRAVENOUS
  Filled 2016-09-13: qty 2

## 2016-09-13 MED ORDER — SODIUM CHLORIDE 0.9 % IV BOLUS (SEPSIS)
1000.0000 mL | Freq: Once | INTRAVENOUS | Status: AC
  Administered 2016-09-13: 1000 mL via INTRAVENOUS

## 2016-09-13 NOTE — ED Provider Notes (Signed)
[facility]   CSN: [id number] Arrival date & time: 09/13/16  1414     History   Chief Complaint Chief Complaint  Patient presents with  . Emesis    HPI Maria Farmer is a 21 y.o. female presents to ED with generalized abdominal pain associated with nausea and vomiting x 2 weeks.  Alleviating factors include taking hot baths.  No alleviating factors, phenergan occasionally helps with nausea. No recent fevers, headache, CP, SOB, urinary symptoms, vaginal bleeding or discharge, exposure to suspicious foods, recent travel, known sick contacts with similar symptoms.  No diarrhea, melena or hematochezia.  Denies flu like illness.  Patient is sexually active but does not think she is pregnant, has implant for contraception.  No h/o IBS, ulcerative colitis, PUD, gastritis.  Patient reports "occasional" ETOH use at social gatherings.  Denies recent marijuana use, notes last time was >1 week ago.    Patient reports being evaluated in ED for similar symptoms a few times before.  She notes today's symptoms are similar to previous that brought her to ED. She has also been evaluated by GI last year, reports no one has been able to find the cause of her symptoms.   HPI  Past Medical History:  Diagnosis Date  . Depression    "went to [facility] in [place]"    Patient Active Problem List   Diagnosis Date Noted  . UTI (lower urinary tract infection) 12/28/2014  . Nausea with vomiting 12/28/2014  . Abdominal pain 12/28/2014  . Nausea & vomiting 12/28/2014    Past Surgical History:  Procedure Laterality Date  . NO PAST SURGERIES      OB History    No data available       Home Medications    Prior to Admission medications   Medication Sig Start Date End Date Taking? Authorizing Provider  etonogestrel (IMPLANON) 68 MG IMPL implant Inject 1 each into the skin once.   Yes Historical Provider, MD  ondansetron (ZOFRAN ODT) 8 MG disintegrating tablet Take 1 tablet (8  mg total) by mouth every 8 (eight) hours as needed for nausea or vomiting. 09/03/16  Yes Paula Libra, MD  promethazine (PHENERGAN) 25 MG suppository Place 1 suppository (25 mg total) rectally every 6 (six) hours as needed for nausea or vomiting. 09/05/16  Yes Tatyana Kirichenko, PA-C  albuterol (PROVENTIL HFA;VENTOLIN HFA) 108 (90 BASE) MCG/ACT inhaler Inhale 1-2 puffs into the lungs every 6 (six) hours as needed for wheezing or shortness of breath. 08/23/14   Glynn Octave, MD    Family History Family History  Problem Relation Age of Onset  . Diabetes Mellitus II Maternal Grandmother     Social History Social History  Substance Use Topics  . Smoking status: Former Smoker    Packs/day: 0.10    Years: 3.00    Types: Cigarettes    Quit date: 08/13/2016  . Smokeless tobacco: Never Used  . Alcohol use Yes     Comment: 12/29/2014 "might drink a beer couple times/month"     Allergies   Peanuts [peanut oil]   Review of Systems Review of Systems  Constitutional: Positive for appetite change. Negative for chills and fever.  Eyes: Negative for visual disturbance.  Respiratory: Negative for cough and shortness of breath.   Cardiovascular: Negative for chest pain.  Gastrointestinal: Positive for abdominal pain, nausea and vomiting. Negative for blood in stool, constipation and diarrhea.  Genitourinary: Negative for dysuria, frequency, hematuria, pelvic pain, vaginal bleeding and vaginal  discharge.  Musculoskeletal: Negative for back pain.  Allergic/Immunologic: Negative for immunocompromised state.     Physical Exam Updated Vital Signs BP (!) 99/52 (BP Location: Right Arm)   Pulse 60   Temp 98.2 F (36.8 C) (Oral)   Resp 18   Ht  (1.6 m)   Wt 52.2 kg   LMP 08/22/2016   SpO2 100%   BMI 20.37 kg/m   Physical Exam  Constitutional: She is oriented to person, place, and time. She appears well-developed and well-nourished.  HENT:  Head: Normocephalic and atraumatic.  Nose:  Nose normal.  Mouth/Throat: Oropharynx is clear and moist. No oropharyngeal exudate.  Moist mucous membranes  Eyes: Conjunctivae and EOM are normal. Pupils are equal, round, and reactive to light.  No conjunctival injection  Neck: Normal range of motion. Neck supple. No JVD present.  No cervical adenopathy  Cardiovascular: Normal rate, regular rhythm, normal heart sounds and intact distal pulses.   No murmur heard. Pulmonary/Chest: Effort normal and breath sounds normal. No respiratory distress. She has no wheezes. She has no rales. She exhibits no tenderness.  Abdominal: Soft. Bowel sounds are normal. She exhibits no distension and no mass. There is tenderness. There is no rebound and no guarding.  +Diffuse abdominal tenderness otherwise abdomen is soft, without distention, guarding or rigidity + Bowel sounds throughout.  No suprapubic tenderness. No CVAT.  Negative Murphy's. Negative McBurney's.  Non palpable kidneys. No hepatosplenomegaly.   Musculoskeletal: Normal range of motion. She exhibits no deformity.  Neurological: She is alert and oriented to person, place, and time. No sensory deficit.  Skin: Skin is warm and dry. Capillary refill takes less than 2 seconds.  Psychiatric: She has a normal mood and affect. Her behavior is normal. Judgment and thought content normal.  Nursing note and vitals reviewed.    ED Treatments / Results  Labs (all labs ordered are listed, but only abnormal results are displayed) Labs Reviewed  COMPREHENSIVE METABOLIC PANEL - Abnormal; Notable for the following:       Result Value   Chloride 100 (*)    Glucose, Bld 108 (*)    Total Protein 8.5 (*)    AST 42 (*)    All other components within normal limits  CBC WITH DIFFERENTIAL/PLATELET - Abnormal; Notable for the following:    Hemoglobin 15.1 (*)    MCHC 36.2 (*)    All other components within normal limits  URINALYSIS, ROUTINE W REFLEX MICROSCOPIC - Abnormal; Notable for the following:     APPearance CLOUDY (*)    Ketones, ur 40 (*)    Protein, ur 100 (*)    Leukocytes, UA SMALL (*)    All other components within normal limits  RAPID URINE DRUG SCREEN, HOSP PERFORMED - Abnormal; Notable for the following:    Tetrahydrocannabinol POSITIVE (*)    All other components within normal limits  URINALYSIS, MICROSCOPIC (REFLEX) - Abnormal; Notable for the following:    Bacteria, UA RARE (*)    Squamous Epithelial / LPF 6-30 (*)    All other components within normal limits  PREGNANCY, URINE    EKG  EKG Interpretation None       Radiology No results found.  Procedures Procedures (including critical care time)  Medications Ordered in ED Medications  sodium chloride 0.9 % bolus 1,000 mL (0 mLs Intravenous Stopped 09/13/16 1616)  capsicum (ZOSTRIX) 0.075 % cream (1 application Topical Given 09/13/16 1437)  metoCLOPramide (REGLAN) injection 10 mg (10 mg Intravenous Given 09/13/16 1450)  Initial Impression / Assessment and Plan / ED Course  I have reviewed the triage vital signs and the nursing notes.  Pertinent labs & imaging results that were available during my care of the patient were reviewed by me and considered in my medical decision making (see chart for details).  Clinical Course as of Sep 13 2024  Thu Sep 13, 2016  2013 Temp: 98.2 F (36.8 C) [CG]  2013 Pulse Rate: 61 [CG]  2013 BP: 127/80 [CG]  2013 Resp: 18 [CG]  2013 SpO2: 99 % [CG]  2013 Sodium: 138 [CG]  2013 Potassium: 3.8 [CG]  2013 Chloride: (!) 100 [CG]  2013 Creatinine: 0.95 [CG]  2014 AST: (!) 42 [CG]  2014 ALT: 28 [CG]  2014 Alkaline Phosphatase: 44 [CG]  2014 WBC: 7.1 [CG]  2014 Hemoglobin: (!) 15.1 [CG]  2014 Specific Gravity, Urine: 1.021 [CG]  2014 Nitrite: NEGATIVE [CG]  2014 WBC, UA: 0-5 [CG]  2014 Preg Test, Ur: NEGATIVE [CG]  2014 Repeat abdominal exam much improved after capsaicin cream, patient denies nausea or abdominal pain.  Tetrahydrocannabinol: (!) POSITIVE [CG]      Clinical Course User Index [CG] Liberty Handy, PA-C   Initial differential diagnosis includes viral gastroenteritis, gastritis, appy or PUD.  Most likely cannabis hyperemesis given chronic, daily and heavy use of THC.  No h/o GERD, ulcers, heavy NSAID or ETOH use.  Abdominal pain is diffuse, not epigastric or RLQ.  On exam vital signs are wnl, patient is non toxic appearing but initially screaming in pain and dry heaving upon arrival to ED.  Abdominal pain, nausea and vomiting completely resolved after antiemetic and capsaicin.  Patient tolerated PO fluid and food challenge without return of symptoms.  +THC in UDS.  Negative Hcg. CBC, CMP and u/a negative. Last CT A/P on 09/04/16 was normal.  No further emergent lab work or imaging is thought to be indicated at this time given drastic improvement of symptoms and normal lab work today.  Discussed side effects of heavy THC used. Recommended cessation.  Patient denies recent use.  Patient given substance abuse resources. Patient and relative at bedside verbalized understanding. All questions and concerns addressed prior to discharge. Advised to f/u with GI who evaluated her last year for similar symptoms.  Final Clinical Impressions(s) / ED Diagnoses   Final diagnoses:  Nausea and vomiting in adult  Generalized abdominal pain    New Prescriptions Discharge Medication List as of 09/13/2016  4:16 PM       Liberty Handy, PA-C 09/13/16 2026    Courteney Lyn Mackuen, MD 09/14/16 [phone]

## 2016-09-13 NOTE — Discharge Instructions (Signed)
Your lab work was normal today. Please continue using phenergan for nausea as needed.  I encourage you to decrease your use of marijuana as this may be contributing to your cyclical nausea, vomiting and abdominal pain.  Please see attached resources on substance abuse counseling. Follow up with your primary care provider for further assistance if you are interested in cutting back or stopping marijuana use.    Return to the ED if your abdominal pain, nausea or vomiting are associated with fever, bloody stools, light-headedness.

## 2016-09-13 NOTE — ED Triage Notes (Signed)
EMS transport from home. Pt reports n/v x 2 weeks. Has been treated here for similar sx in the past

## 2016-09-13 NOTE — ED Notes (Signed)
Patient given some ginger ale to drink for fluid challenge. Patient sipping on drink when I left the room

## 2016-09-19 ENCOUNTER — Encounter (HOSPITAL_BASED_OUTPATIENT_CLINIC_OR_DEPARTMENT_OTHER): Payer: Self-pay

## 2016-09-19 ENCOUNTER — Emergency Department (HOSPITAL_BASED_OUTPATIENT_CLINIC_OR_DEPARTMENT_OTHER)
Admission: EM | Admit: 2016-09-19 | Discharge: 2016-09-19 | Disposition: A | Discharge status: Home / Self Care | Payer: Medicaid Other | Attending: Emergency Medicine | Admitting: Emergency Medicine

## 2016-09-19 DIAGNOSIS — R112 Nausea with vomiting, unspecified: Secondary | ICD-10-CM | POA: Diagnosis present

## 2016-09-19 DIAGNOSIS — Z87891 Personal history of nicotine dependence: Secondary | ICD-10-CM | POA: Insufficient documentation

## 2016-09-19 MED ORDER — PROMETHAZINE HCL 25 MG RE SUPP: 25.0000 mg | Freq: Four times a day (QID) | RECTAL | 1 refills | Status: DC | PRN

## 2016-09-19 MED ORDER — SODIUM CHLORIDE 0.9 % IV BOLUS (SEPSIS)
1000.0000 mL | Freq: Once | INTRAVENOUS | Status: AC
  Administered 2016-09-19: 1000 mL via INTRAVENOUS

## 2016-09-19 MED ORDER — PROMETHAZINE HCL 25 MG/ML IJ SOLN
INTRAMUSCULAR | Status: AC
  Filled 2016-09-19: qty 1

## 2016-09-19 MED ORDER — PANTOPRAZOLE SODIUM 40 MG IV SOLR
40.0000 mg | Freq: Once | INTRAVENOUS | Status: AC
  Administered 2016-09-19: 40 mg via INTRAVENOUS
  Filled 2016-09-19: qty 40

## 2016-09-19 MED ORDER — CAPSAICIN 0.075 % EX CREA
TOPICAL_CREAM | CUTANEOUS | Status: DC | PRN
  Administered 2016-09-19: 06:00:00 via TOPICAL
  Filled 2016-09-19: qty 60

## 2016-09-19 MED ORDER — PROMETHAZINE HCL 25 MG/ML IJ SOLN
25.0000 mg | Freq: Once | INTRAMUSCULAR | Status: AC
  Administered 2016-09-19: 25 mg via INTRAVENOUS

## 2016-09-19 MED ORDER — SODIUM CHLORIDE 0.9 % IV BOLUS (SEPSIS)
1000.0000 mL | Freq: Once | INTRAVENOUS | Status: AC
Start: 2016-09-19 — End: 2016-09-19
  Administered 2016-09-19: 1000 mL via INTRAVENOUS

## 2016-09-19 NOTE — ED Provider Notes (Signed)
[facility]-EMERGENCY DEPT Provider Note: [name], MD, FACEP  CSN: [id number] MRN: [id number] ARRIVAL: 09/19/16 at 0551 ROOM: [facility]   CHIEF COMPLAINT  Vomiting   HISTORY OF PRESENT ILLNESS  Maria Farmer is a 21 y.o. female with a history of multiple visits to the areas ED's for intractable nausea and vomiting with abdominal pain. She has had ED visits as recently as 09/16/2016. She has had 6 ED visits in the past 2 weeks.  She has had persistent nausea and vomiting for the past several days and has used a home Zofran and Phenergan prescriptions. She had her birth control implant removed yesterday and since then has had no further abdominal pain. The nausea and vomiting persist. She has not had diarrhea but this may be due to lack of oral intake. She does have burning in her throat which she attributes to acid.  She does have a history of cannabis use in the past and has responded to topical capsicin abdominally. She was getting relief with this at home but stopped using it after her abdominal pain resolved yesterday.    Past Medical History:  Diagnosis Date  . Depression    "went to [facility] in [place]"    Past Surgical History:  Procedure Laterality Date  . NO PAST SURGERIES      Family History  Problem Relation Age of Onset  . Diabetes Mellitus II Maternal Grandmother     Social History  Substance Use Topics  . Smoking status: Former Smoker    Packs/day: 0.10    Years: 3.00    Types: Cigarettes    Quit date: 08/13/2016  . Smokeless tobacco: Never Used  . Alcohol use Yes     Comment: 12/29/2014 "might drink a beer couple times/month"    Prior to Admission medications   Medication Sig Start Date End Date Taking? Authorizing Provider  albuterol (PROVENTIL HFA;VENTOLIN HFA) 108 (90 BASE) MCG/ACT inhaler Inhale 1-2 puffs into the lungs every 6 (six) hours as needed for wheezing or shortness of breath. 08/23/14   Glynn Octave, MD  promethazine (PHENERGAN) 25 MG  suppository Place 1 suppository (25 mg total) rectally every 6 (six) hours as needed for nausea or vomiting. 09/19/16   Paula Libra, MD    Allergies Peanuts [peanut oil]   REVIEW OF SYSTEMS  Negative except as noted here or in the History of Present Illness.   PHYSICAL EXAMINATION  Initial Vital Signs Blood pressure 127/90, pulse (!) 103, temperature 97.9 F (36.6 C), temperature source Oral, resp. rate 18, height  (1.6 m), weight 110 lb (49.9 kg), last menstrual period 08/22/2016, SpO2 100 %.  Examination General: Well-developed, well-nourished female in no acute distress; appearance consistent with age of record HENT: normocephalic; atraumatic Eyes: pupils equal, round and reactive to light; extraocular muscles intact Neck: supple Heart: regular rate and rhythm; tachycardia Lungs: clear to auscultation bilaterally Abdomen: soft; nondistended; nontender; no masses or hepatosplenomegaly; bowel sounds present Extremities: No deformity; full range of motion; pulses normal Neurologic: Awake, alert and oriented; motor function intact in all extremities and symmetric; no facial droop Skin: Warm and dry Psychiatric: Flat affect   RESULTS  Summary of this visit's results, reviewed by myself:   EKG Interpretation  Date/Time:    Ventricular Rate:    PR Interval:    QRS Duration:   QT Interval:    QTC Calculation:   R Axis:     Text Interpretation:        Laboratory Studies: No  results found for this or any previous visit (from the past 24 hour(s)). Imaging Studies: No results found.  ED COURSE  Nursing notes and initial vitals signs, including pulse oximetry, reviewed.  Vitals:   09/19/16 0556 09/19/16 0557 09/19/16 0815  BP: 127/90  (!) 141/94  Pulse: (!) 103  90  Resp: 18  18  Temp: 97.9 F (36.6 C)    TempSrc: Oral    SpO2: 100%  99%  Weight:  110 lb (49.9 kg)   Height:   (1.6 m)    6:51 AM Patient feeling better after IV fluids, Phenergan and  topical capsaicin.   PROCEDURES    ED DIAGNOSES     ICD-9-CM ICD-10-CM   1. Nausea and vomiting in adult 787.01 R11.2        Paula Libra, MD 09/19/16 2244

## 2016-09-19 NOTE — ED Triage Notes (Signed)
Pt c/o chronic vomiting, states had her implant removed yesterday states this may be a side effect

## 2016-10-26 ENCOUNTER — Encounter (HOSPITAL_BASED_OUTPATIENT_CLINIC_OR_DEPARTMENT_OTHER): Payer: Self-pay | Admitting: Emergency Medicine

## 2016-10-26 ENCOUNTER — Emergency Department (HOSPITAL_BASED_OUTPATIENT_CLINIC_OR_DEPARTMENT_OTHER)
Admission: EM | Admit: 2016-10-26 | Discharge: 2016-10-26 | Disposition: A | Discharge status: Home / Self Care | Payer: Medicaid Other | Attending: Dermatology | Admitting: Dermatology

## 2016-10-26 DIAGNOSIS — R1030 Lower abdominal pain, unspecified: Secondary | ICD-10-CM | POA: Diagnosis not present

## 2016-10-26 DIAGNOSIS — Z87891 Personal history of nicotine dependence: Secondary | ICD-10-CM | POA: Insufficient documentation

## 2016-10-26 DIAGNOSIS — O26899 Other specified pregnancy related conditions, unspecified trimester: Secondary | ICD-10-CM | POA: Insufficient documentation

## 2016-10-26 DIAGNOSIS — Z5321 Procedure and treatment not carried out due to patient leaving prior to being seen by health care provider: Secondary | ICD-10-CM | POA: Insufficient documentation

## 2016-10-26 LAB — URINALYSIS, ROUTINE W REFLEX MICROSCOPIC
Bilirubin Urine: NEGATIVE
GLUCOSE, UA: NEGATIVE mg/dL
Hgb urine dipstick: NEGATIVE
Ketones, ur: NEGATIVE mg/dL
LEUKOCYTES UA: NEGATIVE
Nitrite: NEGATIVE
PH: 7 (ref 5.0–8.0)
PROTEIN: NEGATIVE mg/dL
SPECIFIC GRAVITY, URINE: 1.025 (ref 1.005–1.030)

## 2016-10-26 LAB — PREGNANCY, URINE: Preg Test, Ur: POSITIVE — AB

## 2016-10-26 NOTE — ED Triage Notes (Signed)
Low abd pain with cramping, mild spotting x several days. Pt had pos home preg test.

## 2016-10-26 NOTE — ED Notes (Signed)
Called in waiting room x 2. 

## 2016-11-06 ENCOUNTER — Emergency Department (HOSPITAL_BASED_OUTPATIENT_CLINIC_OR_DEPARTMENT_OTHER): Payer: Medicaid Other

## 2016-11-06 ENCOUNTER — Encounter (HOSPITAL_BASED_OUTPATIENT_CLINIC_OR_DEPARTMENT_OTHER): Payer: Self-pay | Admitting: *Deleted

## 2016-11-06 ENCOUNTER — Emergency Department (HOSPITAL_BASED_OUTPATIENT_CLINIC_OR_DEPARTMENT_OTHER)
Admission: EM | Admit: 2016-11-06 | Discharge: 2016-11-06 | Disposition: A | Discharge status: Home / Self Care | Payer: Medicaid Other | Attending: Emergency Medicine | Admitting: Emergency Medicine

## 2016-11-06 DIAGNOSIS — O468X1 Other antepartum hemorrhage, first trimester: Secondary | ICD-10-CM

## 2016-11-06 DIAGNOSIS — O418X10 Other specified disorders of amniotic fluid and membranes, first trimester, not applicable or unspecified: Secondary | ICD-10-CM

## 2016-11-06 DIAGNOSIS — O208 Other hemorrhage in early pregnancy: Secondary | ICD-10-CM | POA: Diagnosis not present

## 2016-11-06 DIAGNOSIS — R1030 Lower abdominal pain, unspecified: Secondary | ICD-10-CM | POA: Insufficient documentation

## 2016-11-06 DIAGNOSIS — Z87891 Personal history of nicotine dependence: Secondary | ICD-10-CM | POA: Diagnosis not present

## 2016-11-06 DIAGNOSIS — O218 Other vomiting complicating pregnancy: Secondary | ICD-10-CM | POA: Diagnosis present

## 2016-11-06 DIAGNOSIS — Z3490 Encounter for supervision of normal pregnancy, unspecified, unspecified trimester: Secondary | ICD-10-CM

## 2016-11-06 DIAGNOSIS — G43A0 Cyclical vomiting, not intractable: Secondary | ICD-10-CM | POA: Diagnosis not present

## 2016-11-06 DIAGNOSIS — R1115 Cyclical vomiting syndrome unrelated to migraine: Secondary | ICD-10-CM

## 2016-11-06 HISTORY — DX: Cyclical vomiting syndrome unrelated to migraine: R11.15

## 2016-11-06 LAB — CBC WITH DIFFERENTIAL/PLATELET
Basophils Absolute: 0 10*3/uL (ref 0.0–0.1)
Basophils Relative: 0 %
Eosinophils Absolute: 0 10*3/uL (ref 0.0–0.7)
Eosinophils Relative: 1 %
HCT: 37 % (ref 36.0–46.0)
Hemoglobin: 13.5 g/dL (ref 12.0–15.0)
Lymphocytes Relative: 28 %
Lymphs Abs: 1.6 10*3/uL (ref 0.7–4.0)
MCH: 31.5 pg (ref 26.0–34.0)
MCHC: 36.5 g/dL — ABNORMAL HIGH (ref 30.0–36.0)
MCV: 86.2 fL (ref 78.0–100.0)
Monocytes Absolute: 0.6 10*3/uL (ref 0.1–1.0)
Monocytes Relative: 11 %
Neutro Abs: 3.5 10*3/uL (ref 1.7–7.7)
Neutrophils Relative %: 60 %
Platelets: 213 10*3/uL (ref 150–400)
RBC: 4.29 MIL/uL (ref 3.87–5.11)
RDW: 11.4 % — ABNORMAL LOW (ref 11.5–15.5)
WBC: 5.8 10*3/uL (ref 4.0–10.5)

## 2016-11-06 LAB — URINALYSIS, ROUTINE W REFLEX MICROSCOPIC
Bilirubin Urine: NEGATIVE
Bilirubin Urine: NEGATIVE
GLUCOSE, UA: NEGATIVE mg/dL
Glucose, UA: NEGATIVE mg/dL
HGB URINE DIPSTICK: NEGATIVE
Hgb urine dipstick: NEGATIVE
Ketones, ur: 15 mg/dL — AB
Ketones, ur: >80 mg/dL — AB
Leukocytes, UA: NEGATIVE
Nitrite: NEGATIVE
Nitrite: NEGATIVE
PH: 8.5 — AB (ref 5.0–8.0)
Protein, ur: 100 mg/dL — AB
Protein, ur: 100 mg/dL — AB
SPECIFIC GRAVITY, URINE: 1.023 (ref 1.005–1.030)
Specific Gravity, Urine: 1.025 (ref 1.005–1.030)
pH: 8.5 — ABNORMAL HIGH (ref 5.0–8.0)

## 2016-11-06 LAB — COMPREHENSIVE METABOLIC PANEL
ALT: 14 U/L (ref 14–54)
AST: 24 U/L (ref 15–41)
Albumin: 4.4 g/dL (ref 3.5–5.0)
Alkaline Phosphatase: 37 U/L — ABNORMAL LOW (ref 38–126)
Anion gap: 10 (ref 5–15)
BUN: 5 mg/dL — ABNORMAL LOW (ref 6–20)
CO2: 22 mmol/L (ref 22–32)
Calcium: 9.6 mg/dL (ref 8.9–10.3)
Chloride: 104 mmol/L (ref 101–111)
Creatinine, Ser: 0.67 mg/dL (ref 0.44–1.00)
GFR calc Af Amer: >60 mL/min (ref >60)
GFR calc non Af Amer: >60 mL/min (ref >60)
Glucose, Bld: 99 mg/dL (ref 65–99)
Potassium: 3.3 mmol/L — ABNORMAL LOW (ref 3.5–5.1)
Sodium: 136 mmol/L (ref 135–145)
Total Bilirubin: 0.6 mg/dL (ref 0.3–1.2)
Total Protein: 7.6 g/dL (ref 6.5–8.1)

## 2016-11-06 LAB — HCG, QUANTITATIVE, PREGNANCY: hCG, Beta Chain, Quant, S: 87701 m[IU]/mL — ABNORMAL HIGH (ref <5)

## 2016-11-06 LAB — URINALYSIS, MICROSCOPIC (REFLEX)

## 2016-11-06 LAB — PREGNANCY, URINE: Preg Test, Ur: POSITIVE — AB

## 2016-11-06 LAB — LIPASE, BLOOD: Lipase: 20 U/L (ref 11–51)

## 2016-11-06 MED ORDER — ONDANSETRON 4 MG PO TBDP: 4.0000 mg | ORAL_TABLET | Freq: Three times a day (TID) | ORAL | 0 refills | Status: DC | PRN

## 2016-11-06 MED ORDER — ONDANSETRON HCL 4 MG/2ML IJ SOLN
4.0000 mg | Freq: Once | INTRAMUSCULAR | Status: AC
  Administered 2016-11-06: 4 mg via INTRAVENOUS
  Filled 2016-11-06: qty 2

## 2016-11-06 MED ORDER — ONDANSETRON 4 MG PO TBDP: 4.0000 mg | ORAL_TABLET | Freq: Once | ORAL | Status: DC

## 2016-11-06 MED ORDER — SODIUM CHLORIDE 0.9 % IV BOLUS (SEPSIS)
1000.0000 mL | Freq: Once | INTRAVENOUS | Status: AC
  Administered 2016-11-06: 1000 mL via INTRAVENOUS

## 2016-11-06 MED ORDER — PROMETHAZINE HCL 25 MG/ML IJ SOLN
25.0000 mg | Freq: Once | INTRAMUSCULAR | Status: AC
  Administered 2016-11-06: 25 mg via INTRAVENOUS
  Filled 2016-11-06: qty 1

## 2016-11-06 NOTE — ED Provider Notes (Signed)
[facility] Provider Note   CSN: [id number] Arrival date & time: 11/06/16  1256     History   Chief Complaint Chief Complaint  Patient presents with  . Emesis    HPI Maria Farmer is a 21 y.o. female with history of cyclic vomiting syndrome who presents today with chief complaint acute onset of nausea and vomiting. She states she has not been able to keep any food down for 2 days. She endorses development of the lower abdominal pain which is intermittent and crampy in nature. She endorses developing a bloody emesis yesterday. She has not tried anything for her symptoms. She was informed she was pregnant on UA done here 10/26/16. She has not had any prenatal care or followed up with OB/GYN at this time. She states her last menstrual period lasted for 3 weeks and ended 09/21/16 due to removal of IUD. She currently denies any vaginal itching, pain, bleeding, discharge, melena, hematuria, dysuria, back pain, flank pain, chest pain, shortness of breath, fevers, chills.   The history is provided by the patient.    Past Medical History:  Diagnosis Date  . Cyclic vomiting syndrome   . Depression    "went to [facility] in [place]"    Patient Active Problem List   Diagnosis Date Noted  . UTI (lower urinary tract infection) 12/28/2014  . Nausea with vomiting 12/28/2014  . Abdominal pain 12/28/2014  . Nausea & vomiting 12/28/2014    Past Surgical History:  Procedure Laterality Date  . NO PAST SURGERIES      OB History    No data available       Home Medications    Prior to Admission medications   Medication Sig Start Date End Date Taking? Authorizing Provider  albuterol (PROVENTIL HFA;VENTOLIN HFA) 108 (90 BASE) MCG/ACT inhaler Inhale 1-2 puffs into the lungs every 6 (six) hours as needed for wheezing or shortness of breath. 08/23/14   Rancour, Jeannett Senior, MD  promethazine (PHENERGAN) 25 MG suppository Place 1 suppository (25 mg total) rectally every 6 (six) hours as  needed for nausea or vomiting. 09/19/16   Molpus, Jonny Ruiz, MD    Family History Family History  Problem Relation Age of Onset  . Diabetes Mellitus II Maternal Grandmother     Social History Social History  Substance Use Topics  . Smoking status: Former Smoker    Packs/day: 0.10    Years: 3.00    Types: Cigarettes    Quit date: 08/13/2016  . Smokeless tobacco: Never Used  . Alcohol use Yes     Comment: 12/29/2014 "might drink a beer couple times/month"     Allergies   Peanuts [peanut oil]   Review of Systems Review of Systems  Constitutional: Negative for chills and fever.  Gastrointestinal: Positive for abdominal pain, nausea and vomiting. Negative for blood in stool.  Genitourinary: Negative for dysuria, flank pain, hematuria, vaginal bleeding, vaginal discharge and vaginal pain.  Musculoskeletal: Negative for back pain.  Neurological: Negative for syncope and headaches.  All other systems reviewed and are negative.    Physical Exam Updated Vital Signs BP (!) 102/59 (BP Location: Left Arm)   Pulse 74   Temp 97.6 F (36.4 C) (Oral)   Resp 16   Ht 5\' 3"  (1.6 m)   Wt 52.6 kg (116 lb)   LMP 09/21/2016 (Approximate) Comment: Ended 4/13 and lasted 3 wks  SpO2 100%   BMI 20.55 kg/m   Physical Exam  Constitutional: She appears well-developed and well-nourished. No  distress.  Actively vomiting while in room  HENT:  Head: Normocephalic and atraumatic.  Eyes: Conjunctivae are normal. Right eye exhibits no discharge. Left eye exhibits no discharge.  Neck: No JVD present. No tracheal deviation present.  Cardiovascular: Normal rate, regular rhythm and normal heart sounds.   Pulmonary/Chest: Effort normal and breath sounds normal.  Abdominal: Soft. She exhibits no distension and no mass. There is tenderness. There is no rebound. No hernia.  Hypoactive bowel sounds, maximally tender in the suprapubic region with tenderness in the right lower quadrant and left lower quadrant  and left upper quadrant. Murphy's absent, psoas absent, Rovsing's absent  Genitourinary:  Genitourinary Comments: No CVA tenderness  Musculoskeletal: Normal range of motion. She exhibits no edema.  No midline spine tenderness to palpation, no paraspinal muscle tenderness to palpation  Neurological: She is alert.  Skin: Skin is warm and dry. Capillary refill takes less than 2 seconds. She is not diaphoretic.  Psychiatric: She has a normal mood and affect. Her behavior is normal.     ED Treatments / Results  Labs (all labs ordered are listed, but only abnormal results are displayed) Labs Reviewed  PREGNANCY, URINE - Abnormal; Notable for the following:       Result Value   Preg Test, Ur POSITIVE (*)    All other components within normal limits  URINALYSIS, ROUTINE W REFLEX MICROSCOPIC - Abnormal; Notable for the following:    APPearance CLOUDY (*)    pH 8.5 (*)    Ketones, ur 15 (*)    Protein, ur 100 (*)    Leukocytes, UA TRACE (*)    All other components within normal limits  URINALYSIS, MICROSCOPIC (REFLEX) - Abnormal; Notable for the following:    Bacteria, UA MANY (*)    Squamous Epithelial / LPF TOO NUMEROUS TO COUNT (*)    All other components within normal limits  COMPREHENSIVE METABOLIC PANEL - Abnormal; Notable for the following:    Potassium 3.3 (*)    BUN 5 (*)    Alkaline Phosphatase 37 (*)    All other components within normal limits  CBC WITH DIFFERENTIAL/PLATELET - Abnormal; Notable for the following:    MCHC 36.5 (*)    RDW 11.4 (*)    All other components within normal limits  URINALYSIS, ROUTINE W REFLEX MICROSCOPIC - Abnormal; Notable for the following:    APPearance CLOUDY (*)    pH 8.5 (*)    Ketones, ur >80 (*)    Protein, ur 100 (*)    All other components within normal limits  HCG, QUANTITATIVE, PREGNANCY - Abnormal; Notable for the following:    hCG, Beta Chain, Quant, S 87,701 (*)    All other components within normal limits  URINALYSIS,  MICROSCOPIC (REFLEX) - Abnormal; Notable for the following:    Bacteria, UA FEW (*)    Squamous Epithelial / LPF 6-30 (*)    All other components within normal limits  LIPASE, BLOOD    EKG  EKG Interpretation None       Radiology [place] Ob Comp < 14 Wks  Result Date: 11/06/2016 CLINICAL DATA:  Pregnancy. Nausea vomiting and pelvic pain for 2 days. Evaluate for ectopic. History of ovarian cysts. EXAM: OBSTETRIC <14 WK US AND TRANSVAGINAL OB US TECHNIQUE: Both transabdominal and transvaginal ultrasound examinations were performed for complete evaluation of the gestation as well as the maternal uterus, adnexal regions, and pelvic cul-de-sac. Transvaginal technique was performed to assess early pregnancy. COMPARISON:  Abdominal CT 09/04/2016 FINDINGS:  Intrauterine gestational sac: Single Yolk sac:  Visualized. Embryo:  Visualized. Cardiac Activity: Visualized. Heart Rate: 123  bpm CRL:  8 mm  mm   6 w   5 d                  [place] EDC: 06/27/2017 Subchorionic hemorrhage: Small hematoma noted along the right aspect of the sac, 13 mm in diameter. Maternal uterus/adnexae: Simple appearing left ovarian cyst measuring up to 5.3 cm. Development since 09/04/2016 CT confirms benign process. There is color Doppler flow within the neighboring ovarian parenchyma which does not appear edematous. IMPRESSION: 1. Single living intrauterine pregnancy measuring 6 weeks 5 days. 2. 13 mm subchorionic hemorrhage along the right aspect of the sac. 3. 5.3 cm left ovarian cyst that is new from 09/04/2016 abdominal CT. Color Doppler flow seen in the neighboring ovarian parenchyma. Electronically Signed   By: Marnee Spring M.D.   On: 11/06/2016 16:13   US Ob Transvaginal  Result Date: 11/06/2016 CLINICAL DATA:  Pregnancy. Nausea vomiting and pelvic pain for 2 days. Evaluate for ectopic. History of ovarian cysts. EXAM: OBSTETRIC <14 WK [place] AND TRANSVAGINAL OB US TECHNIQUE: Both transabdominal and transvaginal ultrasound  examinations were performed for complete evaluation of the gestation as well as the maternal uterus, adnexal regions, and pelvic cul-de-sac. Transvaginal technique was performed to assess early pregnancy. COMPARISON:  Abdominal CT 09/04/2016 FINDINGS: Intrauterine gestational sac: Single Yolk sac:  Visualized. Embryo:  Visualized. Cardiac Activity: Visualized. Heart Rate: 123  bpm CRL:  8 mm  mm   6 w   5 d                  [place] EDC: 06/27/2017 Subchorionic hemorrhage: Small hematoma noted along the right aspect of the sac, 13 mm in diameter. Maternal uterus/adnexae: Simple appearing left ovarian cyst measuring up to 5.3 cm. Development since 09/04/2016 CT confirms benign process. There is color Doppler flow within the neighboring ovarian parenchyma which does not appear edematous. IMPRESSION: 1. Single living intrauterine pregnancy measuring 6 weeks 5 days. 2. 13 mm subchorionic hemorrhage along the right aspect of the sac. 3. 5.3 cm left ovarian cyst that is new from 09/04/2016 abdominal CT. Color Doppler flow seen in the neighboring ovarian parenchyma. Electronically Signed   By: Marnee Spring M.D.   On: 11/06/2016 16:13    Procedures Procedures (including critical care time)  Medications Ordered in ED Medications  ondansetron (ZOFRAN) injection 4 mg (4 mg Intravenous Given 11/06/16 1450)  sodium chloride 0.9 % bolus 1,000 mL (0 mLs Intravenous Stopped 11/06/16 1650)  promethazine (PHENERGAN) injection 25 mg (25 mg Intravenous Given 11/06/16 1610)     Initial Impression / Assessment and Plan / ED Course  I have reviewed the triage vital signs and the nursing notes.  Pertinent labs & imaging results that were available during my care of the patient were reviewed by me and considered in my medical decision making (see chart for details).     Patient with chronic cyclic vomiting present with nausea and vomiting and lower abdominal pain today. Afebrile, vital signs stable. Known pregnancy.  Ultrasound confirms intrauterine pregnancy measuring 6 weeks and 5 days with small subchorionic hemorrhage and cyst which is deemed to be benign. Beta hCG quantitative 87,701, consistent with [place]. Nausea controlled in the ED with fluids, Zofran, and Phenergan. Low suspicion of PID, TOA, ovarian torsion, appendicitis,  or other acute intra-abdominal pathology. UA consistent with dehydration from vomiting but not consistent with UTI.  On reevaluation patient is resting comfortably in bed and states that she has not vomited in over an hour, but does endorse some residual nausea. She has used capsaicin cream in the past with significant improvement in her nausea; she states she has some at home and will use at home.  discussed findings of CT with patient. Patient is stable for discharge home  with prescription for Zofran and follow-up with OB/GYNthis week  to establish prenatal care. Discussed indications for return to the ED. Pt verbalized understanding of and agreement with plan and is safe for discharge home at this time.   Final Clinical Impressions(s) / ED Diagnoses   Final diagnoses:  Non-intractable cyclical vomiting with nausea  Lower abdominal pain  Intrauterine pregnancy    New Prescriptions New Prescriptions   No medications on file     Bennye Alm 11/06/16 1751    Azalia Bilis, MD 11/06/16 2344

## 2016-11-06 NOTE — ED Triage Notes (Signed)
t c/o vomiting with hx of same, pt states preg  With home preg test but no prenatal care yet

## 2016-11-06 NOTE — ED Notes (Signed)
ED Provider at bedside. 

## 2016-11-06 NOTE — Discharge Instructions (Signed)
Use Zofran and capsaicin as needed for nausea. Follow-up with OB/GYN for establishment of prenatal care. Return to the ED if any concerning symptoms develop.

## 2016-11-06 NOTE — ED Notes (Signed)
Patient returned from Ultrasound. 

## 2016-11-06 NOTE — ED Notes (Signed)
Pt dry heaving, Dr. Deretha EmoryZackowski made aware and also aware of pos preg test and gave verbal order to give zofran.

## 2016-11-06 NOTE — ED Notes (Signed)
Patient transported to Ultrasound 

## 2016-11-06 NOTE — ED Notes (Signed)
  Patient had several episodes of emesis in US and on the way back.

## 2016-11-15 ENCOUNTER — Emergency Department (HOSPITAL_BASED_OUTPATIENT_CLINIC_OR_DEPARTMENT_OTHER)
Admission: EM | Admit: 2016-11-15 | Discharge: 2016-11-15 | Disposition: A | Discharge status: Home / Self Care | Payer: Medicaid Other | Attending: Emergency Medicine | Admitting: Emergency Medicine

## 2016-11-15 ENCOUNTER — Encounter (HOSPITAL_BASED_OUTPATIENT_CLINIC_OR_DEPARTMENT_OTHER): Payer: Self-pay | Admitting: Emergency Medicine

## 2016-11-15 DIAGNOSIS — O200 Threatened abortion: Secondary | ICD-10-CM | POA: Insufficient documentation

## 2016-11-15 DIAGNOSIS — Z3A08 8 weeks gestation of pregnancy: Secondary | ICD-10-CM | POA: Insufficient documentation

## 2016-11-15 LAB — HCG, QUANTITATIVE, PREGNANCY: hCG, Beta Chain, Quant, S: 153558 m[IU]/mL — ABNORMAL HIGH (ref <5)

## 2016-11-15 LAB — CBC
HEMATOCRIT: 35.2 % — AB (ref 36.0–46.0)
Hemoglobin: 13.1 g/dL (ref 12.0–15.0)
MCH: 31.5 pg (ref 26.0–34.0)
MCHC: 37.2 g/dL — ABNORMAL HIGH (ref 30.0–36.0)
MCV: 84.6 fL (ref 78.0–100.0)
Platelets: 208 10*3/uL (ref 150–400)
RBC: 4.16 MIL/uL (ref 3.87–5.11)
RDW: 11.5 % (ref 11.5–15.5)
WBC: 5.6 10*3/uL (ref 4.0–10.5)

## 2016-11-15 LAB — ABO/RH: ABO/RH(D): B POS

## 2016-11-15 LAB — PREGNANCY, URINE: Preg Test, Ur: POSITIVE — AB

## 2016-11-15 NOTE — ED Provider Notes (Signed)
[facility] Provider Note   CSN: [id number] Arrival date & time: 11/15/16  1345     History   Chief Complaint Chief Complaint  Patient presents with  . Follow up    HPI Maria Farmer is a 21 y.o. female.  Patient is approximately [redacted] weeks pregnant. Patient had ultrasound done here May 29 showing a single IUP. Quantitative hCG numbers at that time were approximately 80,000. [facility] and was told that probably had threatened miscarriage. Quantitative hCG number at that time was 10,000. Patient is seen by us frequently for cyclical vomiting syndrome. But she has had no vomiting up for the past 4 days. Patient also has no vaginal bleeding or pelvic or abdominal pain. She is just concerned about the possible miscarriage.      Past Medical History:  Diagnosis Date  . Cyclic vomiting syndrome   . Depression    "[facility] [place]    Patient Active Problem List   Diagnosis Date Noted  . UTI (lower urinary tract infection) 12/28/2014  . Nausea with vomiting 12/28/2014  . Abdominal pain 12/28/2014  . Nausea & vomiting 12/28/2014    Past Surgical History:  Procedure Laterality Date  . NO PAST SURGERIES      OB History    No data available       Home Medications    Prior to Admission medications   Medication Sig Start Date End Date Taking? Authorizing Provider  albuterol (PROVENTIL HFA;VENTOLIN HFA) 108 (90 BASE) MCG/ACT inhaler Inhale 1-2 puffs into the lungs every 6 (six) hours as needed for wheezing or shortness of breath. 08/23/14   Rancour, Jeannett SeniorStephen, MD  ondansetron (ZOFRAN ODT) 4 MG disintegrating tablet Take 1 tablet (4 mg total) by mouth every 8 (eight) hours as needed for nausea or vomiting. 11/06/16   Michela PitcherFawze, Mina A, PA-C  promethazine (PHENERGAN) 25 MG suppository Place 1 suppository (25 mg total) rectally every 6 (six) hours as needed for nausea or vomiting. 09/19/16   Molpus, John, MD    Family  History Family History  Problem Relation Age of Onset  . Diabetes Mellitus II Maternal Grandmother     Social History Social History  Substance Use Topics  . Smoking status: Former Smoker    Packs/day: 0.10    Years: 3.00    Types: Cigarettes    Quit date: 08/13/2016  . Smokeless tobacco: Never Used  . Alcohol use Yes     Comment: 12/29/2014 "might drink a beer couple times/month"     Allergies   Peanuts [peanut oil]   Review of Systems Review of Systems  Constitutional: Negative for fever.  HENT: Negative for congestion.   Eyes: Negative for redness.  Respiratory: Negative for shortness of breath.   Cardiovascular: Negative for chest pain.  Gastrointestinal: Negative for abdominal pain, nausea and vomiting.  Genitourinary: Negative for pelvic pain, vaginal bleeding and vaginal discharge.  Musculoskeletal: Negative for back pain.  Skin: Negative for rash.  Neurological: Negative for headaches.  Hematological: Does not bruise/bleed easily.  Psychiatric/Behavioral: Negative for confusion.     Physical Exam Updated Vital Signs BP 106/71 (BP Location: Right Arm)   Pulse 73   Temp 98.2 F (36.8 C) (Oral)   Resp 18   Wt 48.2 kg (106 lb 3 oz)   LMP 09/24/2016   SpO2 100%   BMI 18.81 kg/m   Physical Exam  Constitutional: She is oriented to person, place, and time.  She appears well-developed and well-nourished. No distress.  HENT:  Head: Normocephalic and atraumatic.  Mouth/Throat: Oropharynx is clear and moist.  Eyes: Conjunctivae and EOM are normal. Pupils are equal, round, and reactive to light.  Neck: Normal range of motion. Neck supple.  Cardiovascular: Normal rate, regular rhythm and normal heart sounds.   Pulmonary/Chest: Effort normal and breath sounds normal. No respiratory distress.  Abdominal: Soft. Bowel sounds are normal. There is no tenderness.  Musculoskeletal: Normal range of motion. She exhibits no edema.  Neurological: She is alert and oriented  to person, place, and time. No cranial nerve deficit or sensory deficit. She exhibits normal muscle tone. Coordination normal.  Skin: Skin is warm.  Nursing note and vitals reviewed.    ED Treatments / Results  Labs (all labs ordered are listed, but only abnormal results are displayed) Labs Reviewed  PREGNANCY, URINE - Abnormal; Notable for the following:       Result Value   Preg Test, Ur POSITIVE (*)    All other components within normal limits  CBC - Abnormal; Notable for the following:    HCT 35.2 (*)    MCHC 37.2 (*)    All other components within normal limits  HCG, QUANTITATIVE, PREGNANCY - Abnormal; Notable for the following:    hCG, Beta Chain, [place] Longest 161,096 (*)    All other components within normal limits  ABO/RH   Results for orders placed or performed during the hospital encounter of 11/15/16  Pregnancy, urine  Result Value Ref Range   Preg Test, Ur POSITIVE (A) NEGATIVE  CBC  Result Value Ref Range   WBC 5.6 4.0 - 10.5 K/uL   RBC 4.16 3.87 - 5.11 MIL/uL   Hemoglobin 13.1 12.0 - 15.0 g/dL   HCT 04.5 (L) 40.9 - 81.1 %   MCV 84.6 78.0 - 100.0 fL   MCH 31.5 26.0 - 34.0 pg   MCHC 37.2 (H) 30.0 - 36.0 g/dL   RDW 91.4 78.2 - 95.6 %   Platelets 208 150 - 400 K/uL  hCG, quantitative, pregnancy  Result Value Ref Range   hCG, Beta Chain, Quant, S 153,558 (H) <5 mIU/mL  ABO/Rh  Result Value Ref Range   ABO/RH(D) B POS    No rh immune globuloin      NOT A RH IMMUNE GLOBULIN CANDIDATE, PT RH POSITIVE Performed at [facility], [address] [place], [place] [address]      EKG  EKG Interpretation None       Radiology No results found.  Procedures Procedures (including critical care time)  Medications Ordered in ED Medications - No data to display   Initial Impression / Assessment and Plan / ED Course  I have reviewed the triage vital signs and the nursing notes.  Pertinent labs & imaging results that were available during my care of the  patient were reviewed by me and considered in my medical decision making (see chart for details).    Based on her quantitative hCG numbers pregnancy seems to be developing appropriately for your approximate gestational age of [redacted] weeks. Also clinically not having any symptoms consistent with a miscarriage at this time. Patient has follow-up with OB/GYN in [facility] area. Previous ultrasound showed IUP.  Labs from the visit at [place] [facility] on June 2 had a quantitative hCG of 10,000 this appears to be erroneous based on our findings from today and our comparison quantitative hCG from May 29.   Final Clinical  Impressions(s) / ED Diagnoses   Final diagnoses:  [redacted] weeks gestation of pregnancy    New Prescriptions New Prescriptions   No medications on file     Vanetta Mulders, MD 11/15/16 1726

## 2016-11-15 NOTE — ED Triage Notes (Signed)
Pt approximately [redacted] weeks pregnant. Pt states [facility] was [facility] and told she had a miscarriage but states her paperwork stated threatened miscarriage. Pt states she wants to find out what is going on. Pt with no complaints at this time. Pt states she did not call back to the ER to request clarification. Pt also states she has not been able to get an appt with an OB.

## 2016-11-15 NOTE — Discharge Instructions (Signed)
Pregnancy seems to be developing appropriately. Follow-up with your OB/GYN at [place]. Return for any vaginal bleeding or increased abdominal pain.

## 2017-04-05 ENCOUNTER — Other Ambulatory Visit (HOSPITAL_COMMUNITY): Payer: Self-pay | Admitting: Specialist

## 2017-04-05 DIAGNOSIS — O365930 Maternal care for other known or suspected poor fetal growth, third trimester, not applicable or unspecified: Secondary | ICD-10-CM

## 2017-04-05 DIAGNOSIS — Z3A29 29 weeks gestation of pregnancy: Secondary | ICD-10-CM

## 2017-04-05 DIAGNOSIS — Z3689 Encounter for other specified antenatal screening: Secondary | ICD-10-CM

## 2017-04-10 ENCOUNTER — Encounter (HOSPITAL_COMMUNITY): Payer: Self-pay | Admitting: *Deleted

## 2017-04-11 ENCOUNTER — Ambulatory Visit (HOSPITAL_COMMUNITY)
Admission: RE | Admit: 2017-04-11 | Discharge: 2017-04-11 | Disposition: A | Discharge status: Home / Self Care | Payer: Medicaid Other | Source: Ambulatory Visit | Attending: Specialist | Admitting: Specialist

## 2017-04-11 ENCOUNTER — Encounter (HOSPITAL_COMMUNITY): Payer: Self-pay

## 2017-04-11 DIAGNOSIS — Z363 Encounter for antenatal screening for malformations: Secondary | ICD-10-CM | POA: Insufficient documentation

## 2017-04-11 DIAGNOSIS — Z3A29 29 weeks gestation of pregnancy: Secondary | ICD-10-CM | POA: Diagnosis not present

## 2017-04-11 DIAGNOSIS — O365930 Maternal care for other known or suspected poor fetal growth, third trimester, not applicable or unspecified: Secondary | ICD-10-CM

## 2017-04-11 DIAGNOSIS — Z3689 Encounter for other specified antenatal screening: Secondary | ICD-10-CM

## 2017-04-24 ENCOUNTER — Other Ambulatory Visit (HOSPITAL_COMMUNITY): Payer: Self-pay

## 2017-09-16 ENCOUNTER — Encounter (HOSPITAL_COMMUNITY): Payer: Self-pay

## 2018-08-31 ENCOUNTER — Emergency Department (HOSPITAL_COMMUNITY): Payer: Medicaid Other | Admitting: Certified Registered Nurse Anesthetist

## 2018-08-31 ENCOUNTER — Encounter (HOSPITAL_COMMUNITY)
Admission: EM | Disposition: A | Discharge status: Home / Self Care | Payer: Self-pay | Source: Home / Self Care | Attending: Emergency Medicine

## 2018-08-31 ENCOUNTER — Other Ambulatory Visit: Payer: Self-pay

## 2018-08-31 ENCOUNTER — Ambulatory Visit (HOSPITAL_COMMUNITY)
Admission: EM | Admit: 2018-08-31 | Discharge: 2018-08-31 | Disposition: A | Discharge status: Home / Self Care | Payer: Medicaid Other | Attending: Emergency Medicine | Admitting: Emergency Medicine

## 2018-08-31 ENCOUNTER — Encounter (HOSPITAL_COMMUNITY): Payer: Self-pay | Admitting: Emergency Medicine

## 2018-08-31 ENCOUNTER — Emergency Department (HOSPITAL_COMMUNITY): Payer: Medicaid Other

## 2018-08-31 DIAGNOSIS — X58XXXA Exposure to other specified factors, initial encounter: Secondary | ICD-10-CM | POA: Diagnosis not present

## 2018-08-31 DIAGNOSIS — N7689 Other specified inflammation of vagina and vulva: Secondary | ICD-10-CM | POA: Diagnosis present

## 2018-08-31 DIAGNOSIS — R58 Hemorrhage, not elsewhere classified: Secondary | ICD-10-CM

## 2018-08-31 DIAGNOSIS — Z87891 Personal history of nicotine dependence: Secondary | ICD-10-CM | POA: Insufficient documentation

## 2018-08-31 DIAGNOSIS — S3023XA Contusion of vagina and vulva, initial encounter: Secondary | ICD-10-CM | POA: Insufficient documentation

## 2018-08-31 DIAGNOSIS — N9489 Other specified conditions associated with female genital organs and menstrual cycle: Secondary | ICD-10-CM

## 2018-08-31 HISTORY — PX: HEMATOMA EVACUATION: SHX5118

## 2018-08-31 LAB — CBC WITH DIFFERENTIAL/PLATELET
Abs Immature Granulocytes: 0.01 10*3/uL (ref 0.00–0.07)
Basophils Absolute: 0 10*3/uL (ref 0.0–0.1)
Basophils Relative: 0 %
Eosinophils Absolute: 0 10*3/uL (ref 0.0–0.5)
Eosinophils Relative: 0 %
HCT: 36.1 % (ref 36.0–46.0)
Hemoglobin: 12 g/dL (ref 12.0–15.0)
Immature Granulocytes: 0 %
Lymphocytes Relative: 42 %
Lymphs Abs: 3.3 10*3/uL (ref 0.7–4.0)
MCH: 28.3 pg (ref 26.0–34.0)
MCHC: 33.2 g/dL (ref 30.0–36.0)
MCV: 85.1 fL (ref 80.0–100.0)
Monocytes Absolute: 0.8 10*3/uL (ref 0.1–1.0)
Monocytes Relative: 10 %
Neutro Abs: 3.8 10*3/uL (ref 1.7–7.7)
Neutrophils Relative %: 48 %
Platelets: 249 10*3/uL (ref 150–400)
RBC: 4.24 MIL/uL (ref 3.87–5.11)
RDW: 13.4 % (ref 11.5–15.5)
WBC: 7.8 10*3/uL (ref 4.0–10.5)
nRBC: 0 % (ref 0.0–0.2)

## 2018-08-31 LAB — TYPE AND SCREEN
ABO/RH(D): B POS
Antibody Screen: NEGATIVE

## 2018-08-31 LAB — BASIC METABOLIC PANEL
Anion gap: 20 — ABNORMAL HIGH (ref 5–15)
BUN: 5 mg/dL — ABNORMAL LOW (ref 6–20)
CO2: 16 mmol/L — ABNORMAL LOW (ref 22–32)
Calcium: 9.3 mg/dL (ref 8.9–10.3)
Chloride: 106 mmol/L (ref 98–111)
Creatinine, Ser: 0.79 mg/dL (ref 0.44–1.00)
GFR calc Af Amer: >60 mL/min (ref >60)
GFR calc non Af Amer: >60 mL/min (ref >60)
Glucose, Bld: 87 mg/dL (ref 70–99)
Potassium: 3.1 mmol/L — ABNORMAL LOW (ref 3.5–5.1)
Sodium: 142 mmol/L (ref 135–145)

## 2018-08-31 LAB — I-STAT BETA HCG BLOOD, ED (MC, WL, AP ONLY): I-stat hCG, quantitative: <5 m[IU]/mL (ref <5)

## 2018-08-31 LAB — I-STAT CREATININE, ED: Creatinine, Ser: 0.4 mg/dL — ABNORMAL LOW (ref 0.44–1.00)

## 2018-08-31 LAB — ABO/RH: ABO/RH(D): B POS

## 2018-08-31 SURGERY — EVACUATION HEMATOMA
Anesthesia: General

## 2018-08-31 MED ORDER — HYDROMORPHONE HCL 1 MG/ML IJ SOLN
1.0000 mg | Freq: Once | INTRAMUSCULAR | Status: AC
  Administered 2018-08-31: 1 mg via INTRAVENOUS
  Filled 2018-08-31: qty 1

## 2018-08-31 MED ORDER — OXYCODONE-ACETAMINOPHEN 5-325 MG PO TABS: 1.0000 | ORAL_TABLET | Freq: Four times a day (QID) | ORAL | 0 refills | Status: DC | PRN

## 2018-08-31 MED ORDER — FENTANYL CITRATE (PF) 100 MCG/2ML IJ SOLN
100.0000 ug | Freq: Once | INTRAMUSCULAR | Status: DC
  Filled 2018-08-31: qty 2

## 2018-08-31 MED ORDER — OXYCODONE HCL 5 MG/5ML PO SOLN: 5.0000 mg | Freq: Once | ORAL | Status: AC | PRN

## 2018-08-31 MED ORDER — SUCCINYLCHOLINE CHLORIDE 200 MG/10ML IV SOSY
PREFILLED_SYRINGE | INTRAVENOUS | Status: DC | PRN
  Administered 2018-08-31: 140 mg via INTRAVENOUS

## 2018-08-31 MED ORDER — MIDAZOLAM HCL 2 MG/2ML IJ SOLN
INTRAMUSCULAR | Status: AC
  Filled 2018-08-31: qty 2

## 2018-08-31 MED ORDER — OXYCODONE HCL 5 MG PO TABS
ORAL_TABLET | ORAL | Status: AC
  Filled 2018-08-31: qty 1

## 2018-08-31 MED ORDER — ONDANSETRON HCL 4 MG/2ML IJ SOLN
INTRAMUSCULAR | Status: DC | PRN
  Administered 2018-08-31: 4 mg via INTRAVENOUS

## 2018-08-31 MED ORDER — FENTANYL CITRATE (PF) 100 MCG/2ML IJ SOLN
100.0000 ug | Freq: Once | INTRAMUSCULAR | Status: AC
  Administered 2018-08-31: 100 ug via INTRAVENOUS
  Filled 2018-08-31: qty 2

## 2018-08-31 MED ORDER — LACTATED RINGERS IV SOLN
INTRAVENOUS | Status: DC | PRN
  Administered 2018-08-31: 10:00:00 via INTRAVENOUS

## 2018-08-31 MED ORDER — HYDROMORPHONE HCL 1 MG/ML IJ SOLN
0.5000 mg | Freq: Once | INTRAMUSCULAR | Status: AC
  Administered 2018-08-31: 0.5 mg via INTRAVENOUS
  Filled 2018-08-31: qty 1

## 2018-08-31 MED ORDER — DEXAMETHASONE SODIUM PHOSPHATE 10 MG/ML IJ SOLN
INTRAMUSCULAR | Status: DC | PRN
  Administered 2018-08-31: 10 mg via INTRAVENOUS

## 2018-08-31 MED ORDER — PROPOFOL 10 MG/ML IV BOLUS
INTRAVENOUS | Status: AC
  Filled 2018-08-31: qty 20

## 2018-08-31 MED ORDER — LACTATED RINGERS IV BOLUS
1000.0000 mL | Freq: Once | INTRAVENOUS | Status: AC
  Administered 2018-08-31: 1000 mL via INTRAVENOUS

## 2018-08-31 MED ORDER — OXYCODONE-ACETAMINOPHEN 5-325 MG PO TABS
ORAL_TABLET | ORAL | Status: AC
  Filled 2018-08-31: qty 1

## 2018-08-31 MED ORDER — LACTATED RINGERS IV SOLN
INTRAVENOUS | Status: DC | PRN
  Administered 2018-08-31 (×2): via INTRAVENOUS

## 2018-08-31 MED ORDER — MIDAZOLAM HCL 2 MG/2ML IJ SOLN
INTRAMUSCULAR | Status: DC | PRN
  Administered 2018-08-31: 2 mg via INTRAVENOUS

## 2018-08-31 MED ORDER — DEXAMETHASONE SODIUM PHOSPHATE 10 MG/ML IJ SOLN
INTRAMUSCULAR | Status: AC
  Filled 2018-08-31: qty 1

## 2018-08-31 MED ORDER — OXYCODONE HCL 5 MG PO TABS
5.0000 mg | ORAL_TABLET | Freq: Once | ORAL | Status: AC | PRN
  Administered 2018-08-31: 5 mg via ORAL

## 2018-08-31 MED ORDER — FENTANYL CITRATE (PF) 100 MCG/2ML IJ SOLN
25.0000 ug | INTRAMUSCULAR | Status: DC | PRN
  Administered 2018-08-31: 25 ug via INTRAVENOUS

## 2018-08-31 MED ORDER — FENTANYL CITRATE (PF) 100 MCG/2ML IJ SOLN
100.0000 ug | Freq: Once | INTRAMUSCULAR | Status: AC
  Administered 2018-08-31: 100 ug via INTRAVENOUS

## 2018-08-31 MED ORDER — FENTANYL CITRATE (PF) 250 MCG/5ML IJ SOLN
INTRAMUSCULAR | Status: AC
  Filled 2018-08-31: qty 5

## 2018-08-31 MED ORDER — FENTANYL CITRATE (PF) 100 MCG/2ML IJ SOLN
INTRAMUSCULAR | Status: AC
  Filled 2018-08-31: qty 2

## 2018-08-31 MED ORDER — FENTANYL CITRATE (PF) 250 MCG/5ML IJ SOLN
INTRAMUSCULAR | Status: DC | PRN
  Administered 2018-08-31: 100 ug via INTRAVENOUS
  Administered 2018-08-31: 50 ug via INTRAVENOUS

## 2018-08-31 MED ORDER — LIDOCAINE 2% (20 MG/ML) 5 ML SYRINGE
INTRAMUSCULAR | Status: DC | PRN
  Administered 2018-08-31: 40 mg via INTRAVENOUS

## 2018-08-31 MED ORDER — IOHEXOL 300 MG/ML  SOLN
100.0000 mL | Freq: Once | INTRAMUSCULAR | Status: AC | PRN
  Administered 2018-08-31: 100 mL via INTRAVENOUS

## 2018-08-31 MED ORDER — PROPOFOL 10 MG/ML IV BOLUS
INTRAVENOUS | Status: DC | PRN
  Administered 2018-08-31: 20 mg via INTRAVENOUS
  Administered 2018-08-31: 160 mg via INTRAVENOUS

## 2018-08-31 MED ORDER — HYDROMORPHONE HCL 1 MG/ML IJ SOLN: 1.0000 mg | INTRAMUSCULAR | Status: DC | PRN

## 2018-08-31 MED ORDER — ONDANSETRON HCL 4 MG/2ML IJ SOLN: 4.0000 mg | Freq: Once | INTRAMUSCULAR | Status: DC | PRN

## 2018-08-31 MED ORDER — CEFAZOLIN SODIUM-DEXTROSE 2-3 GM-%(50ML) IV SOLR
INTRAVENOUS | Status: DC | PRN
  Administered 2018-08-31: 2 g via INTRAVENOUS

## 2018-08-31 MED ORDER — PHENYLEPHRINE 40 MCG/ML (10ML) SYRINGE FOR IV PUSH (FOR BLOOD PRESSURE SUPPORT)
PREFILLED_SYRINGE | INTRAVENOUS | Status: DC | PRN
  Administered 2018-08-31: 80 ug via INTRAVENOUS
  Administered 2018-08-31 (×3): 120 ug via INTRAVENOUS
  Administered 2018-08-31: 80 ug via INTRAVENOUS

## 2018-08-31 MED ORDER — LIDOCAINE 2% (20 MG/ML) 5 ML SYRINGE
INTRAMUSCULAR | Status: AC
  Filled 2018-08-31: qty 5

## 2018-08-31 MED ORDER — ONDANSETRON HCL 4 MG/2ML IJ SOLN
INTRAMUSCULAR | Status: AC
  Filled 2018-08-31: qty 2

## 2018-08-31 SURGICAL SUPPLY — 15 items
CATH ROBINSON RED A/P 16FR (CATHETERS) ×2 IMPLANT
GLOVE BIOGEL PI IND STRL 7.0 (GLOVE) ×2 IMPLANT
GLOVE BIOGEL PI INDICATOR 7.0 (GLOVE) ×2
GLOVE ECLIPSE 7.0 STRL STRAW (GLOVE) ×4 IMPLANT
GOWN STRL REUS W/ TWL LRG LVL3 (GOWN DISPOSABLE) ×2 IMPLANT
GOWN STRL REUS W/TWL LRG LVL3 (GOWN DISPOSABLE) ×2
HEMOSTAT ARISTA ABSORB 3G PWDR (HEMOSTASIS) ×2 IMPLANT
HIBICLENS CHG 4% 4OZ BTL (MISCELLANEOUS) ×2 IMPLANT
NS IRRIG 1000ML POUR BTL (IV SOLUTION) ×2 IMPLANT
PACK VAGINAL MINOR WOMEN LF (CUSTOM PROCEDURE TRAY) ×2 IMPLANT
PAD OB MATERNITY 4.3X12.25 (PERSONAL CARE ITEMS) ×2 IMPLANT
SUT MON AB 2-0 CT1 36 (SUTURE) ×2 IMPLANT
TOP CONDUCTIVE GREEN 3/8 (MISCELLANEOUS) ×2 IMPLANT
TOWEL GREEN STERILE FF (TOWEL DISPOSABLE) ×4 IMPLANT
UNDERPAD 30X30 (UNDERPADS AND DIAPERS) ×2 IMPLANT

## 2018-08-31 NOTE — ED Notes (Signed)
Consent signed by MD, pt and witnessed by RN and at bedside.

## 2018-08-31 NOTE — Op Note (Signed)
Preoperative diagnosis: vulvar hematoma  Postoperative diagnosis: Same  Procedure: Evacuation of hematoma, bleeding control  Surgeon: Shelbie Proctor. Shawnie Pons, M.D.  Anesthesia: Percell Boston, MD  Findings: enlarging hematoma with rupture     EBL: 500 cc  Reason for procedure: Patient is a 23 y.o. yo G2P1001 who is here following intercourse which resulted in profuse vulvar swelling.  Procedure: Patient was placed in dorsal lithotomy after general analgesia. Time out performed.  Vulva was prepped. Suction placed through incision and clot and blood evacuated. Areas of bleeding noted. Control of bleeding done with 2-0 Monocryl on CT1 in a locked running fashion until hemostasis mostly achieved. Still some oozing and pressure held + Arista applied.This concluded the procedure. Patient tolerated the procedure well. All instrument, needle, and lap counts were correct x 2 and she was taken to recovery for pressure with ice pack in stable condition.  Reva Bores, MD 08/31/2018 11:08 AM

## 2018-08-31 NOTE — Discharge Instructions (Signed)
How to Take a [employer] A sitz bath is a warm water bath that may be used to care for your rectum, genital area, or the area between your rectum and genitals (perineum). For a sitz bath, the water only comes up to your hips and covers your buttocks. A sitz bath may done at home in a bathtub or with a portable sitz bath that fits over the toilet. Your health care provider may recommend a sitz bath to help:  Relieve pain and discomfort after delivering a baby.  Relieve pain and itching from hemorrhoids or anal fissures.  Relieve pain after certain surgeries.  Relax muscles that are sore or tight. How to take a sitz bath Take 3-4 sitz baths a day, or as many as told by your health care provider. Bathtub sitz bath To take a sitz bath in a bathtub: 1. Partially fill a bathtub with warm water. The water should be deep enough to cover your hips and buttocks when you are sitting in the tub. 2. If your health care provider told you to put medicine in the water, follow his or her instructions. 3. Sit in the water. 4. Open the tub drain a little, and leave it open during your bath. 5. Turn on the warm water again, enough to replace the water that is draining out. Keep the water running throughout your bath. This helps keep the water at the right level and the right temperature. 6. Soak in the water for 15-20 minutes, or as long as told by your health care provider. 7. When you are done, be careful when you stand up. You may feel dizzy. 8. After the sitz bath, pat yourself dry. Do not rub your skin to dry it.  Over-the-toilet sitz bath To take a sitz bath with an over-the-toilet basin: 1. Follow the manufacturer's instructions. 2. Fill the basin with warm water. 3. If your health care provider told you to put medicine in the water, follow his or her instructions. 4. Sit on the seat. Make sure the water covers your buttocks and perineum. 5. Soak in the water for 15-20 minutes, or as long as told by  your health care provider. 6. After the sitz bath, pat yourself dry. Do not rub your skin to dry it. 7. Clean and dry the basin between uses. 8. Discard the basin if it cracks, or according to the manufacturer's instructions. Contact a health care provider if:  Your symptoms get worse. Do not continue with sitz baths if your symptoms get worse.  You have new symptoms. If this happens, do not continue with sitz baths until you talk with your health care provider. Summary  A sitz bath is a warm water bath in which the water only comes up to your hips and covers your buttocks.  A sitz bath may help relieve itching, relieve pain, and relax muscles that are sore or tight in the lower part of your body, including your genital area.  Take 3-4 sitz baths a day, or as many as told by your health care provider. Soak in the water for 15-20 minutes.  Do not continue with sitz baths if your symptoms get worse. This information is not intended to replace advice given to you by your health care provider. Make sure you discuss any questions you have with your health care provider. Document Released: 02/18/2004 Document Revised: 05/30/2017 Document Reviewed: 05/30/2017 Elsevier Interactive Patient Education  2019 [employer]  Hematoma A hematoma is a collection of blood.  A hematoma can happen:  Under the skin.  In an organ.  In a body space.  In a joint space.  In other tissues. The blood can thicken (clot) to form a lump that you can see and feel. The lump is often hard and may become sore and tender. The lump can be very small or very big. Most hematomas get better in a few days to weeks. However, some hematomas may be serious and need medical care. What are the causes? This condition is caused by:  An injury.  Blood that leaks under the skin.  Problems from surgeries.  Medical conditions that cause bleeding or bruising. What increases the risk? You are more likely to develop this  condition if:  You are an older adult.  You use medicines that thin your blood. What are the signs or symptoms? Symptoms depend on where the hematoma is in your body.  If the hematoma is under the skin, there is: ? A firm lump on the body. ? Pain and tenderness in the area. ? Bruising. The skin above the lump may be blue, dark blue, purple-red, or yellowish.  If the hematoma is deep in the tissues or body spaces, there may be: ? Blood in the stomach. This may cause pain in the belly (abdomen), weakness, passing out (fainting), and shortness of breath. ? Blood in the head. This may cause a headache, weakness, trouble speaking or understanding speech, or passing out. How is this diagnosed? This condition is diagnosed based on:  Your medical history.  A physical exam.  Imaging tests, such as ultrasound or CT scan.  Blood tests. How is this treated? Treatment depends on the cause, size, and location of the hematoma. Treatment may include:  Doing nothing. Many hematomas go away on their own without treatment.  Surgery or close monitoring. This may be needed for large hematomas or hematomas that affect the body's organs.  Medicines. These may be given if a medical condition caused the hematoma. Follow these instructions at home: Managing pain, stiffness, and swelling   If told, put ice on the area. ? Put ice in a plastic bag. ? Place a towel between your skin and the bag. ? Leave the ice on for 20 minutes, 2-3 times a day for the first two days.  If told, put heat on the affected area after putting ice on the area for two days. Use the heat source that your doctor tells you to use. This could be a moist heat pack or a heating pad. To do this: ? Place a towel between your skin and the heat source. ? Leave the heat on for 20-30 minutes. ? Remove the heat if your skin turns bright red. This is very important if you are unable to feel pain, heat, or cold. You may have a greater  risk of getting burned.  Raise (elevate) the affected area above the level of your heart while you are sitting or lying down.  Wrap the affected area with an elastic bandage, if told by your doctor. Do not wrap the bandage too tightly.  If your hematoma is on a leg or foot and is painful, your doctor may give you crutches. Use them as told by your doctor. General instructions  Take over-the-counter and prescription medicines only as told by your doctor.  Keep all follow-up visits as told by your doctor. This is important. Contact a doctor if:  You have a fever.  The swelling or bruising gets worse.  You start to get more hematomas. Get help right away if:  Your pain gets worse.  Your pain is not getting better with medicine.  Your skin over the hematoma breaks or starts to bleed.  Your hematoma is in your chest or belly and you: ? Pass out. ? Feel weak. ? Become short of breath.  You have a hematoma on your scalp that is caused by a fall or injury, and you: ? Have a headache that gets worse. ? Have trouble speaking or understanding speech. ? Become less alert or you pass out. Summary  A hematoma is a collection of blood in any part of your body.  Most hematomas get better on their own in a few days to weeks. Some may need medical care.  Follow instructions from your doctor about how to care for your hematoma.  Contact a doctor if the swelling or bruising gets worse, or if you are short of breath. This information is not intended to replace advice given to you by your health care provider. Make sure you discuss any questions you have with your health care provider. Document Released: 07/05/2004 Document Revised: 10/31/2017 Document Reviewed: 10/31/2017 Elsevier Interactive Patient Education  2019 [employer].

## 2018-08-31 NOTE — H&P (Addendum)
Maria Farmer is an 23 y.o. G2P1001 female.   Chief Complaint: labial swelling HPI: here post intercourse with rapidly enlarging left labial hematoma. It is causing significant pain and continues to enlarge.  Past Medical History:  Diagnosis Date  . Cyclic vomiting syndrome   . Depression    "went to [facility] in [place]"    Past Surgical History:  Procedure Laterality Date  . NO PAST SURGERIES      Family History  Problem Relation Age of Onset  . Diabetes Mellitus II Maternal Grandmother    Social History:  reports that she quit smoking about 2 years ago. Her smoking use included cigarettes. She has a 0.30 pack-year smoking history. She has never used smokeless tobacco. She reports current alcohol use. She reports current drug use. Drug: Marijuana.  Allergies:  Allergies  Allergen Reactions  . Peanuts [Peanut Oil] Anaphylaxis    (Not in a hospital admission)   A comprehensive review of systems was negative.  Blood pressure 133/80, pulse 86, temperature 98.5 F (36.9 C), temperature source Oral, resp. rate 17, height 5\' 2"  (1.575 m), weight 53.5 kg, SpO2 95 %, not currently breastfeeding. BP 133/80   Pulse 86   Temp 98.5 F (36.9 C) (Oral)   Resp 17   Ht 5\' 2"  (1.575 m)   Wt 53.5 kg   SpO2 95%   Breastfeeding No   BMI 21.58 kg/m  General appearance: alert, cooperative and appears stated age Head: Normocephalic, without obvious abnormality, atraumatic Neck: supple, symmetrical, trachea midline Lungs: normal effort Heart: regular rate and rhythm Abdomen: soft, non-tender; bowel sounds normal; no masses,  no organomegaly Pelvic: markedly enlarged left labial majora including perineum and vulva Pulses: 2+ and symmetric Lymph nodes: Cervical, supraclavicular, and axillary nodes normal. Neurologic: Grossly normal   Document Information     Lab Results  Component Value Date   WBC 7.8 08/31/2018   HGB 12.0 08/31/2018   HCT 36.1 08/31/2018   MCV 85.1 08/31/2018    PLT 249 08/31/2018   Lab Results  Component Value Date   PREGTESTUR POSITIVE (A) 11/15/2016   HCG <5.0 08/31/2018     Assessment/Plan Principal Problem:   Post-traumatic hematoma of vulva  To OR for evacuation due to pain and swelling and inability to get comfortable. May or may not be able to find source of bleeding. Hgb is stable for now. Next available OR time is 11:15 AM. OR aware.  Risks include but are not limited to bleeding, infection, injury to surrounding structures. Likelihood of success is high.    Reva Bores 08/31/2018, 9:19 AM

## 2018-08-31 NOTE — Anesthesia Preprocedure Evaluation (Signed)
Anesthesia Evaluation  Patient identified by MRN, date of birth, ID band Patient awake    Reviewed: Allergy & Precautions, NPO status , Patient's Chart, lab work & pertinent test results  Airway Mallampati: II  TM Distance: >3 FB Neck ROM: Full    Dental  (+) Teeth Intact, Dental Advisory Given   Pulmonary former smoker,    breath sounds clear to auscultation       Cardiovascular  Rhythm:Regular Rate:Normal     Neuro/Psych    GI/Hepatic   Endo/Other    Renal/GU      Musculoskeletal   Abdominal   Peds  Hematology   Anesthesia Other Findings   Reproductive/Obstetrics                             Anesthesia Physical Anesthesia Plan  ASA: II and emergent  Anesthesia Plan: General   Post-op Pain Management:    Induction: Intravenous  PONV Risk Score and Plan: Ondansetron and Dexamethasone  Airway Management Planned: Oral ETT  Additional Equipment:   Intra-op Plan:   Post-operative Plan: Extubation in OR  Informed Consent: I have reviewed the patients History and Physical, chart, labs and discussed the procedure including the risks, benefits and alternatives for the proposed anesthesia with the patient or authorized representative who has indicated his/her understanding and acceptance.   Dental advisory given  Plan Discussed with: CRNA and Anesthesiologist  Anesthesia Plan Comments:         Anesthesia Quick Evaluation  

## 2018-08-31 NOTE — ED Provider Notes (Addendum)
[facility] EMERGENCY DEPARTMENT Provider Note   CSN: [id number] Arrival date & time: 08/31/18  6283    History   Chief Complaint Chief Complaint  Patient presents with   Abscess    HPI Maria Farmer is a 23 y.o. female presents for evaluation of acute onset, progressively worsening left labial swelling beginning just prior to arrival.  Patient reports that she was having sexual intercourse with her partner and "when we got done there was this pain and swelling". Denies vaginal bleeding or discharge. Denies abdominal pain, nausea, or vomiting.  Has not tried anything for her symptoms.  Worsens with any palpation or movement. Reports pain as severe.      The history is provided by the patient.    Past Medical History:  Diagnosis Date   Cyclic vomiting syndrome    Depression    "went to [facility] in [employer]"    Patient Active Problem List   Diagnosis Date Noted   Post-traumatic hematoma of vulva 08/31/2018   UTI (lower urinary tract infection) 12/28/2014   Nausea with vomiting 12/28/2014   Abdominal pain 12/28/2014   Nausea & vomiting 12/28/2014    Past Surgical History:  Procedure Laterality Date   NO PAST SURGERIES       OB History    Gravida  2   Para  1   Term  1   Preterm      AB      Living  1     SAB      TAB      Ectopic      Multiple      Live Births               Home Medications    Prior to Admission medications   Medication Sig Start Date End Date Taking? Authorizing Provider  albuterol (PROVENTIL HFA;VENTOLIN HFA) 108 (90 BASE) MCG/ACT inhaler Inhale 1-2 puffs into the lungs every 6 (six) hours as needed for wheezing or shortness of breath. Patient not taking: Reported on 04/11/2017 08/23/14   Glynn Octave, MD  ondansetron (ZOFRAN ODT) 4 MG disintegrating tablet Take 1 tablet (4 mg total) by mouth every 8 (eight) hours as needed for nausea or vomiting. Patient not taking: Reported on 04/11/2017  11/06/16   Michela Pitcher A, PA-C  oxyCODONE-acetaminophen (PERCOCET/ROXICET) 5-325 MG tablet Take 1-2 tablets by mouth every 6 (six) hours as needed. 08/31/18   Reva Bores, MD  promethazine (PHENERGAN) 25 MG suppository Place 1 suppository (25 mg total) rectally every 6 (six) hours as needed for nausea or vomiting. Patient not taking: Reported on 04/11/2017 09/19/16   Molpus, John, MD    Family History Family History  Problem Relation Age of Onset   Diabetes Mellitus II Maternal Grandmother     Social History Social History   Tobacco Use   Smoking status: Former Smoker    Packs/day: 0.10    Years: 3.00    Pack years: 0.30    Types: Cigarettes    Last attempt to quit: 08/13/2016    Years since quitting: 2.0   Smokeless tobacco: Never Used  Substance Use Topics   Alcohol use: Yes    Comment: 12/29/2014 "might drink a beer couple times/month"   Drug use: Yes    Types: Marijuana    Comment: daily use- last use2 days ago     Allergies   Peanuts [peanut oil]   Review of Systems Review of Systems  Constitutional: Negative for  fever.  Gastrointestinal: Negative for abdominal pain, nausea and vomiting.  Genitourinary: Positive for vaginal pain. Negative for vaginal bleeding and vaginal discharge.  All other systems reviewed and are negative.    Physical Exam Updated Vital Signs BP 125/82    Pulse 69    Temp 97.6 F (36.4 C)    Resp 14    Ht 5\' 2"  (1.575 m)    Wt 53.5 kg    SpO2 100%    Breastfeeding No    BMI 21.58 kg/m   Physical Exam Vitals signs and nursing note reviewed. Exam conducted with a chaperone present.  Constitutional:      General: She is in acute distress.     Appearance: She is well-developed.     Comments: Tearful, crying, appears quite uncomfortable  HENT:     Head: Normocephalic and atraumatic.  Eyes:     General:        Right eye: No discharge.        Left eye: No discharge.     Conjunctiva/sclera: Conjunctivae normal.  Neck:     Vascular:  No JVD.     Trachea: No tracheal deviation.  Cardiovascular:     Rate and Rhythm: Tachycardia present.  Pulmonary:     Effort: Pulmonary effort is normal.     Breath sounds: Normal breath sounds.  Abdominal:     General: Abdomen is flat. There is no distension.     Palpations: Abdomen is soft.     Tenderness: There is no abdominal tenderness. There is no guarding or rebound.  Genitourinary:    Labia:        Left: Tenderness and injury present.      Comments: significant swelling to the left labia, approximately egg-sized, exquisitely tender. No bleeding or discharge noted. Examination limited due to pain and swelling.  Skin:    General: Skin is warm and dry.     Findings: No erythema.  Neurological:     Mental Status: She is alert.  Psychiatric:        Behavior: Behavior normal.      ED Treatments / Results  Labs (all labs ordered are listed, but only abnormal results are displayed) Labs Reviewed  BASIC METABOLIC PANEL - Abnormal; Notable for the following components:      Result Value   Potassium 3.1 (*)    CO2 16 (*)    BUN 5 (*)    Anion gap 20 (*)    All other components within normal limits  I-STAT CREATININE, ED - Abnormal; Notable for the following components:   Creatinine, Ser 0.40 (*)    All other components within normal limits  CBC WITH DIFFERENTIAL/PLATELET  I-STAT BETA HCG BLOOD, ED ([facility], [facility], [facility] ONLY)  TYPE AND SCREEN  ABO/RH    EKG None  Radiology Ct Pelvis W Contrast  Result Date: 08/31/2018 CLINICAL DATA:  Patient complains of painful abscess and pelvic area the eating today after sexual encounter. EXAM: CT PELVIS WITH CONTRAST TECHNIQUE: Multidetector CT imaging of the pelvis was performed using the standard protocol following the bolus administration of intravenous contrast. CONTRAST:  OMNIPAQUE IOHEXOL 300 MG/ML  SOLN COMPARISON:  09/04/2016 FINDINGS: Urinary Tract:  No abnormality visualized. Bowel: Visualized small bowel, appendix and  colon are normal. Rectum is normal. Vascular/Lymphatic: No adenopathy.  Vascular structures are normal. Reproductive: Uterus and ovaries are within normal. There is a somewhat heterogeneous oval masslike collection centered over the left side of external genitalia/labia majora measuring 5.2  x 7.6 cm. This has Hounsfield unit measurements in the 20s and 30s with serpiginous hyperdensity over the posterior aspect of the collection likely an acute hemorrhagic component. This likely represents a large hematoma or active bleeding into existing Bartholin cyst. Other:  None. Musculoskeletal: Normal. IMPRESSION: Large heterogeneous masslike collection along the left side of the external genitalia/labia majora measuring 5.2 x 7.6 cm. Findings suggest active hemorrhagic component along the posterior aspect of this collection as this likely represents a posttraumatic hematoma. Hemorrhage into a pre-existing Bartholin's gland cyst is possible. Critical Value/emergent results were called by telephone at the time of interpretation on 08/31/2018 at 8:32 am to Dr. Michela Pitcher , who verbally acknowledged these results. Electronically Signed   By: Elberta Fortis M.D.   On: 08/31/2018 08:32    Procedures Procedures (including critical care time)  Medications Ordered in ED Medications  HYDROmorphone (DILAUDID) injection 1-2 mg ( Intravenous MAR Hold 08/31/18 1116)  fentaNYL (SUBLIMAZE) injection 25-50 mcg (25 mcg Intravenous Given 08/31/18 1259)  ondansetron (ZOFRAN) injection 4 mg (has no administration in time range)  fentaNYL (SUBLIMAZE) 100 MCG/2ML injection (has no administration in time range)  oxyCODONE (Oxy IR/ROXICODONE) 5 MG immediate release tablet (has no administration in time range)  fentaNYL (SUBLIMAZE) injection 100 mcg (100 mcg Intravenous Given 08/31/18 0724)  HYDROmorphone (DILAUDID) injection 0.5 mg (0.5 mg Intravenous Given 08/31/18 0748)  iohexol (OMNIPAQUE) 300 MG/ML solution 100 mL (100 mLs Intravenous  Contrast Given 08/31/18 0754)  fentaNYL (SUBLIMAZE) injection 100 mcg (100 mcg Intravenous Given 08/31/18 0819)  HYDROmorphone (DILAUDID) injection 1 mg (1 mg Intravenous Given 08/31/18 0838)  lactated ringers bolus 1,000 mL (1,000 mLs Intravenous New Bag/Given 08/31/18 0931)  oxyCODONE (Oxy IR/ROXICODONE) immediate release tablet 5 mg (5 mg Oral Given 08/31/18 1405)    Or  oxyCODONE (ROXICODONE) 5 MG/5ML solution 5 mg ( Oral See Alternative 08/31/18 1405)     Initial Impression / Assessment and Plan / ED Course  I have reviewed the triage vital signs and the nursing notes.  Pertinent labs & imaging results that were available during my care of the patient were reviewed by me and considered in my medical decision making (see chart for details).        Patient presents for evaluation of left labial swelling which began acutely after intercourse just prior to arrival.  Appears to be progressively worsening on examination with rapid swelling.  She is afebrile, tachycardic and appears quite uncomfortable.  Somewhat difficult to examine however no evidence of external hemorrhage. Will give fentanyl and apply ice.   7:15AM Spoke with Dr. Jolayne Panther with OBGYN who recommends applying ice and obtaining CBC. We will also obtain CT pelvis with contrast for further evaluation.   Lab work reviewed by me shows no leukocytosis, no anemia, no metabolic derangements.  Mildly hypokalemic.  CT of the abdomen and pelvis shows a 5.2 x 7.6 cm heterogenous mass over the left side of the external genitalia/labia majora.  There is some extravasation of contrast suggesting active hemorrhage  of a posttraumatic hematoma versus hemorrhage into a pre-existing Bartholin's gland cyst.  8:32AM Spoke with Dr. Shawnie Pons with OBGYN who recommends consulting IR for recommendations for possible arterial embolization to achieve hemostasis.   8:35AM  Spoke with Dr. Deanne Coffer with interventional radiology who has reviewed images and feels  that arterial embolization could cause significant collateral damage of surrounding tissues and would not recommend this as a first-line approach for hemorrhage control as the patient is currently hemodynamically stable.  8:36AM Spoke with Dr. Shawnie PonsPratt with OB/GYN who will assess the patient emergently in the emergency department. On reassessment, patient reports some improvement of pain. The swelling has progressed and now extends up the mons pubis and down the perineum as well.   9:20AM Dr. Shawnie PonsPratt has seen and evaluated the patient, recommends OR drainage.  Patient seen and evaluated by Dr. Particia NearingHaviland who agrees with assessment and plan at this time.  Final Clinical Impressions(s) / ED Diagnoses   Final diagnoses:  Hemorrhage of pelvic artery  Swelling of labia    ED Discharge Orders         Ordered    Diet - low sodium heart healthy     08/31/18 1115    Increase activity slowly     08/31/18 1115    Call MD for:  temperature >100.4     08/31/18 1115    Call MD for:  persistant nausea and vomiting     08/31/18 1115    Call MD for:  severe uncontrolled pain     08/31/18 1115    Call MD for:  redness, tenderness, or signs of infection (pain, swelling, redness, odor or green/yellow discharge around incision site)     08/31/18 1115    Discharge wound care:    Comments:  Sitz baths tid at least   08/31/18 1115    No dressing needed     08/31/18 1115    Sexual Activity Restrictions    Comments:  None until seen and cleared by physician staff   08/31/18 1115    oxyCODONE-acetaminophen (PERCOCET/ROXICET) 5-325 MG tablet  Every 6 hours PRN     08/31/18 1115           Jeanie SewerFawze, Jacelynn Hayton A, PA-C 08/31/18 0954    Jacalyn LefevreHaviland, Julie, MD 08/31/18 1011    [address]Zadin Lange, WakefieldMina A, PA-C 08/31/18 1538    Jacalyn LefevreHaviland, Julie, MD 09/04/18 [phone]

## 2018-08-31 NOTE — ED Triage Notes (Signed)
Pt reports the abscess just started after she was having sex an hour ago.

## 2018-08-31 NOTE — Transfer of Care (Signed)
Immediate Anesthesia Transfer of Care Note  Patient: Maria Farmer  Procedure(s) Performed: EVACUATION HEMATOMA OF LABIA (N/A )  Patient Location: PACU  Anesthesia Type:General  Level of Consciousness: drowsy  Airway & Oxygen Therapy: Patient Spontanous Breathing and Patient connected to face mask oxygen  Post-op Assessment: Report given to RN and Post -op Vital signs reviewed and stable  Post vital signs: Reviewed and stable  Last Vitals:  Vitals Value Taken Time  BP 99/54   Temp    Pulse 58   Resp 14   SpO2 100     Last Pain:  Vitals:   08/31/18 0830  TempSrc:   PainSc: 10-Worst pain ever         Complications: No apparent anesthesia complications

## 2018-08-31 NOTE — Anesthesia Procedure Notes (Signed)
Procedure Name: Intubation Date/Time: 08/31/2018 10:19 AM Performed by: Bryson Corona, CRNA Pre-anesthesia Checklist: Patient identified, Emergency Drugs available, Suction available and Patient being monitored Patient Re-evaluated:Patient Re-evaluated prior to induction Oxygen Delivery Method: Circle System Utilized Preoxygenation: Pre-oxygenation with 100% oxygen Induction Type: IV induction, Cricoid Pressure applied and Rapid sequence Laryngoscope Size: Mac and 3 Grade View: Grade I Tube type: Oral Number of attempts: 1 Airway Equipment and Method: Stylet and Oral airway Placement Confirmation: ETT inserted through vocal cords under direct vision,  positive ETCO2 and breath sounds checked- equal and bilateral Secured at: 20 cm Tube secured with: Tape Dental Injury: Teeth and Oropharynx as per pre-operative assessment

## 2018-08-31 NOTE — Anesthesia Postprocedure Evaluation (Signed)
Anesthesia Post Note  Patient: Maria Farmer  Procedure(s) Performed: EVACUATION HEMATOMA OF LABIA (N/A )     Patient location during evaluation: PACU Anesthesia Type: General Level of consciousness: awake and alert Pain management: pain level controlled Vital Signs Assessment: post-procedure vital signs reviewed and stable Respiratory status: spontaneous breathing, nonlabored ventilation, respiratory function stable and patient connected to nasal cannula oxygen Cardiovascular status: blood pressure returned to baseline and stable Postop Assessment: no apparent nausea or vomiting Anesthetic complications: no    Last Vitals:  Vitals:   08/31/18 1340 08/31/18 1355  BP: 120/73 125/82  Pulse: 66 69  Resp: 16 14  Temp:    SpO2: 100% 100%    Last Pain:  Vitals:   08/31/18 1240  TempSrc:   PainSc: 6                  Ishita Mcnerney COKER

## 2018-09-01 ENCOUNTER — Encounter (HOSPITAL_COMMUNITY): Payer: Self-pay | Admitting: Family Medicine

## 2018-09-01 ENCOUNTER — Telehealth: Payer: Self-pay | Admitting: Family Medicine

## 2018-09-01 ENCOUNTER — Telehealth: Payer: Self-pay | Admitting: *Deleted

## 2018-09-01 NOTE — Telephone Encounter (Signed)
Attempted to call patient with her post-op appointment. No answer, number stated that the number is unavailable and to try again later. Reminder mailed.

## 2018-09-01 NOTE — Telephone Encounter (Signed)
[facility] TOC CM received call from pt stating her pharmacy is [facility]. Requesting medication transferred from [facility] to [facility]. States she does not have her Medicaid card. Contacted [facility] and provided Medicaid number. They were able to process her Rx with insurance. Contacted pt she can pick up from [place] on [address] in [place]. Explained her boyfriend has to present a photo ID when picking up narcotics. Pt verbalized understanding.  Isidoro Donning RN CCM Case Mgmt phone [phone]

## 2018-09-24 ENCOUNTER — Telehealth: Payer: Self-pay | Admitting: Obstetrics and Gynecology

## 2018-09-24 ENCOUNTER — Ambulatory Visit: Payer: Medicaid Other | Admitting: Family Medicine

## 2018-09-24 ENCOUNTER — Ambulatory Visit: Payer: Medicaid Other | Admitting: Obstetrics and Gynecology

## 2018-09-24 NOTE — Telephone Encounter (Signed)
Called the patient to inform of rescheduled appointment. Left a detailed voicemail message also sending a reminder to mychart.

## 2018-09-29 ENCOUNTER — Ambulatory Visit (INDEPENDENT_AMBULATORY_CARE_PROVIDER_SITE_OTHER): Payer: Medicaid Other | Admitting: Obstetrics and Gynecology

## 2018-09-29 ENCOUNTER — Encounter: Payer: Self-pay | Admitting: Obstetrics and Gynecology

## 2018-09-29 ENCOUNTER — Other Ambulatory Visit: Payer: Self-pay

## 2018-09-29 DIAGNOSIS — Z9889 Other specified postprocedural states: Secondary | ICD-10-CM | POA: Diagnosis not present

## 2018-09-29 DIAGNOSIS — Z309 Encounter for contraceptive management, unspecified: Secondary | ICD-10-CM | POA: Insufficient documentation

## 2018-09-29 DIAGNOSIS — S3023XD Contusion of vagina and vulva, subsequent encounter: Secondary | ICD-10-CM

## 2018-09-29 DIAGNOSIS — Z30016 Encounter for initial prescription of transdermal patch hormonal contraceptive device: Secondary | ICD-10-CM

## 2018-09-29 MED ORDER — NORELGESTROMIN-ETH ESTRADIOL 150-35 MCG/24HR TD PTWK: 1.0000 | MEDICATED_PATCH | TRANSDERMAL | 12 refills | Status: DC

## 2018-09-29 NOTE — Progress Notes (Signed)
Burning sensation on incision.
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
## 2018-11-20 ENCOUNTER — Other Ambulatory Visit: Payer: Self-pay

## 2018-11-20 ENCOUNTER — Encounter (HOSPITAL_BASED_OUTPATIENT_CLINIC_OR_DEPARTMENT_OTHER): Payer: Self-pay | Admitting: Emergency Medicine

## 2018-11-20 ENCOUNTER — Emergency Department (HOSPITAL_BASED_OUTPATIENT_CLINIC_OR_DEPARTMENT_OTHER)
Admission: EM | Admit: 2018-11-20 | Discharge: 2018-11-20 | Disposition: A | Discharge status: Home / Self Care | Payer: Medicaid Other | Attending: Emergency Medicine | Admitting: Emergency Medicine

## 2018-11-20 DIAGNOSIS — R112 Nausea with vomiting, unspecified: Secondary | ICD-10-CM | POA: Insufficient documentation

## 2018-11-20 DIAGNOSIS — R109 Unspecified abdominal pain: Secondary | ICD-10-CM | POA: Diagnosis present

## 2018-11-20 DIAGNOSIS — R1115 Cyclical vomiting syndrome unrelated to migraine: Secondary | ICD-10-CM

## 2018-11-20 DIAGNOSIS — D508 Other iron deficiency anemias: Secondary | ICD-10-CM | POA: Diagnosis not present

## 2018-11-20 DIAGNOSIS — Z87891 Personal history of nicotine dependence: Secondary | ICD-10-CM | POA: Insufficient documentation

## 2018-11-20 DIAGNOSIS — F12188 Cannabis abuse with other cannabis-induced disorder: Secondary | ICD-10-CM | POA: Diagnosis not present

## 2018-11-20 DIAGNOSIS — R197 Diarrhea, unspecified: Secondary | ICD-10-CM | POA: Insufficient documentation

## 2018-11-20 DIAGNOSIS — D509 Iron deficiency anemia, unspecified: Secondary | ICD-10-CM

## 2018-11-20 LAB — COMPREHENSIVE METABOLIC PANEL
ALT: 31 U/L (ref 0–44)
AST: 43 U/L — ABNORMAL HIGH (ref 15–41)
Albumin: 4.7 g/dL (ref 3.5–5.0)
Alkaline Phosphatase: 51 U/L (ref 38–126)
Anion gap: 11 (ref 5–15)
BUN: 10 mg/dL (ref 6–20)
CO2: 22 mmol/L (ref 22–32)
Calcium: 9.5 mg/dL (ref 8.9–10.3)
Chloride: 104 mmol/L (ref 98–111)
Creatinine, Ser: 0.77 mg/dL (ref 0.44–1.00)
GFR calc Af Amer: >60 mL/min (ref >60)
GFR calc non Af Amer: >60 mL/min (ref >60)
Glucose, Bld: 116 mg/dL — ABNORMAL HIGH (ref 70–99)
Potassium: 3.5 mmol/L (ref 3.5–5.1)
Sodium: 137 mmol/L (ref 135–145)
Total Bilirubin: 0.7 mg/dL (ref 0.3–1.2)
Total Protein: 8.3 g/dL — ABNORMAL HIGH (ref 6.5–8.1)

## 2018-11-20 LAB — CBC
HCT: 31.6 % — ABNORMAL LOW (ref 36.0–46.0)
Hemoglobin: 9.1 g/dL — ABNORMAL LOW (ref 12.0–15.0)
MCH: 20.5 pg — ABNORMAL LOW (ref 26.0–34.0)
MCHC: 28.8 g/dL — ABNORMAL LOW (ref 30.0–36.0)
MCV: 71.2 fL — ABNORMAL LOW (ref 80.0–100.0)
Platelets: 318 10*3/uL (ref 150–400)
RBC: 4.44 MIL/uL (ref 3.87–5.11)
RDW: 17.1 % — ABNORMAL HIGH (ref 11.5–15.5)
WBC: 5.5 10*3/uL (ref 4.0–10.5)
nRBC: 0 % (ref 0.0–0.2)

## 2018-11-20 LAB — LIPASE, BLOOD: Lipase: 30 U/L (ref 11–51)

## 2018-11-20 LAB — HCG, SERUM, QUALITATIVE: Preg, Serum: NEGATIVE

## 2018-11-20 MED ORDER — FERROUS SULFATE 325 (65 FE) MG PO TABS: 325.0000 mg | ORAL_TABLET | Freq: Every day | ORAL | 1 refills | Status: DC

## 2018-11-20 MED ORDER — HALOPERIDOL LACTATE 5 MG/ML IJ SOLN
2.0000 mg | Freq: Once | INTRAMUSCULAR | Status: AC
  Administered 2018-11-20: 2 mg via INTRAVENOUS
  Filled 2018-11-20: qty 1

## 2018-11-20 MED ORDER — SODIUM CHLORIDE 0.9 % IV BOLUS
1000.0000 mL | Freq: Once | INTRAVENOUS | Status: AC
  Administered 2018-11-20: 1000 mL via INTRAVENOUS

## 2018-11-20 MED ORDER — ONDANSETRON HCL 4 MG/2ML IJ SOLN
4.0000 mg | Freq: Once | INTRAMUSCULAR | Status: AC
  Administered 2018-11-20: 4 mg via INTRAVENOUS
  Filled 2018-11-20: qty 2

## 2018-11-20 MED ORDER — PROCHLORPERAZINE MALEATE 10 MG PO TABS: 10.0000 mg | ORAL_TABLET | Freq: Two times a day (BID) | ORAL | 0 refills | Status: DC | PRN

## 2018-11-20 NOTE — ED Notes (Signed)
Patient is resting comfortably. 

## 2018-11-20 NOTE — Discharge Instructions (Addendum)
You were evaluated in the Emergency Department and after careful evaluation, we did not find any emergent condition requiring admission or further testing in the hospital.  Your symptoms today could possibly be related to habitual marijuana use.  Your blood testing today was overall reassuring.  You should restart a daily iron supplement, which you we have provided as a prescription.  We recommend that you refrain from marijuana use for at least the next 2 weeks.  Please use the Compazine medication for nausea at home as needed.  Please return to the Emergency Department if you experience any worsening of your condition.  We encourage you to follow up with a primary care provider.  Thank you for allowing [place] to be a part of your care.

## 2018-11-20 NOTE — ED Provider Notes (Signed)
[facility] Emergency Department Provider Note MRN:  [id number]  Arrival date & time: 11/20/18     Chief Complaint   Abdominal Pain   History of Present Illness   Maria Farmer is a 23 y.o. year-old female with a history of cyclic vomiting syndrome presenting to the ED with chief complaint of abdominal pain.  2 days of symptoms, began with watery diarrhea, multiple episodes, no bleeding.  Soon after began experiencing nausea and vomiting, nonbloody nonbilious, multiple episodes.  Diffuse abdominal discomfort that is worse when vomiting.  Mild in severity, intermittent.  Endorsing feeling dehydrated and weak.  Denies headache or vision change, no chest pain or shortness of breath.  Review of Systems  A complete 10 system review of systems was obtained and all systems are negative except as noted in the HPI and PMH.   Patient's Health History    Past Medical History:  Diagnosis Date  . Cyclic vomiting syndrome   . Depression    "[facility] [place]"    Past Surgical History:  Procedure Laterality Date  . HEMATOMA EVACUATION N/A 08/31/2018   Procedure: EVACUATION HEMATOMA OF LABIA;  Surgeon: Reva BoresPratt, Tanya S, MD;  [facility];  Service: Gynecology;  Laterality: N/A;  . NO PAST SURGERIES      Family History  Problem Relation Age of Onset  . Diabetes Mellitus II Maternal Grandmother     Social History   Socioeconomic History  . Marital status: Single    Spouse name: Not on file  . Number of children: Not on file  . Years of education: Not on file  . Highest education level: Not on file  Occupational History  . Not on file  Social Needs  . Financial resource strain: Not on file  . Food insecurity    Worry: Not on file    Inability: Not on file  . Transportation needs    Medical: Not on file    Non-medical: Not on file  Tobacco Use  . Smoking status: Former Smoker    Packs/day: 0.10    Years: 3.00    Pack years: 0.30    Types:  Cigarettes    Quit date: 08/13/2016    Years since quitting: 2.2  . Smokeless tobacco: Never Used  Substance and Sexual Activity  . Alcohol use: Yes    Comment: 12/29/2014 "might drink a beer couple times/month"  . Drug use: Yes    Types: Marijuana    Comment: uses daily, smoked yesterday  . Sexual activity: Yes    Birth control/protection: None  Lifestyle  . Physical activity    Days per week: Not on file    Minutes per session: Not on file  . Stress: Not on file  Relationships  . Social Musicianconnections    Talks on phone: Not on file    Gets together: Not on file    Attends religious service: Not on file    Active member of club or organization: Not on file    Attends meetings of clubs or organizations: Not on file    Relationship status: Not on file  . Intimate partner violence    Fear of current or ex partner: Not on file    Emotionally abused: Not on file    Physically abused: Not on file    Forced sexual activity: Not on file  Other Topics Concern  . Not on file  Social History Narrative  . Not on file  Physical Exam  Vital Signs and Nursing Notes reviewed Vitals:   11/20/18 1110 11/20/18 1130  BP:    Pulse: (!) 59 (!) 50  Resp: 16 17  Temp:    SpO2: 100% 100%    CONSTITUTIONAL: Well-appearing, NAD NEURO:  Alert and oriented x 3, no focal deficits EYES:  eyes equal and reactive ENT/NECK:  no LAD, no JVD CARDIO: Regular rate, well-perfused, normal S1 and S2 PULM:  CTAB no wheezing or rhonchi GI/GU:  normal bowel sounds, non-distended, non-tender MSK/SPINE:  No gross deformities, no edema SKIN:  no rash, atraumatic PSYCH:  Appropriate speech and behavior  Diagnostic and Interventional Summary    EKG Interpretation  Date/Time:  Thursday November 20 2018 11:09:03 EDT Ventricular Rate:  60 PR Interval:    QRS Duration: 89 QT Interval:  438 QTC Calculation: 438 R Axis:   69 Text Interpretation:  Sinus rhythm RSR' in V1 or V2, probably normal variant LVH by  voltage Confirmed by Kennis CarinaBero, Tenya Araque [phone]) on 11/20/2018 11:12:05 AM      Labs Reviewed  CBC - Abnormal; Notable for the following components:      Result Value   Hemoglobin 9.1 (*)    HCT 31.6 (*)    MCV 71.2 (*)    MCH 20.5 (*)    MCHC 28.8 (*)    RDW 17.1 (*)    All other components within normal limits  COMPREHENSIVE METABOLIC PANEL - Abnormal; Notable for the following components:   Glucose, Bld 116 (*)    Total Protein 8.3 (*)    AST 43 (*)    All other components within normal limits  LIPASE, BLOOD  HCG, SERUM, QUALITATIVE    No orders to display    Medications  sodium chloride 0.9 % bolus 1,000 mL (0 mLs Intravenous Stopped 11/20/18 1143)  ondansetron (ZOFRAN) injection 4 mg (4 mg Intravenous Given 11/20/18 1041)  haloperidol lactate (HALDOL) injection 2 mg (2 mg Intravenous Given 11/20/18 1112)     Procedures Critical Care  ED Course and Medical Decision Making  I have reviewed the triage vital signs and the nursing notes.  Pertinent labs & imaging results that were available during my care of the patient were reviewed by me and considered in my medical decision making (see below for details).  Gastroenteritis versus flare of cyclical vomiting versus marijuana hyperemesis versus hyperemesis gravidarum.  Labs pending, including hCG, will provide fluids, Zofran, reassess.  Abdomen is soft and nontender, reassuring.  Patient looking and feeling much better after IV Haldol.  Abdominal exam continues to be reassuring.  Labs are unremarkable, hCG negative.  Patient does have what appears to be iron deficiency anemia.  Patient again denies any hematemesis or blood in stool, states that her periods are symptoms heavy and she has had iron deficiency anemia in the past, is supposed to be taking a daily iron supplement.  Patient is appropriate for outpatient management with a daily iron supplement, provided via prescription.  Compazine for nausea at home.  Patient is a near every  day user of marijuana and so we suspect cannabis hyperemesis syndrome, patient provided education on this condition and advised to refrain from marijuana for at least the next 2 weeks to avoid recurrence of symptoms.  After the discussed management above, the patient was determined to be safe for discharge.  The patient was in agreement with this plan and all questions regarding their care were answered.  ED return precautions were discussed and the patient will return  to the ED with any significant worsening of condition.  Barth Kirks. Sedonia Small, MD [place] [email or web address]  Final Clinical Impressions(s) / ED Diagnoses     ICD-10-CM   1. Nausea vomiting and diarrhea  R11.2    R19.7   2. Non-intractable cyclical vomiting  G50.03   3. Cannabinoid hyperemesis syndrome (HCC)  F12.988   4. Iron deficiency anemia, unspecified iron deficiency anemia type  D50.9     ED Discharge Orders         Ordered    prochlorperazine (COMPAZINE) 10 MG tablet  2 times daily PRN     11/20/18 1151    ferrous sulfate 325 (65 FE) MG tablet  Daily     11/20/18 1151             Maudie Flakes, MD 11/20/18 1155
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
## 2018-11-20 NOTE — ED Notes (Signed)
Drowsy, dry heaves at intervals, then goes back to sleep

## 2018-11-27 ENCOUNTER — Emergency Department (HOSPITAL_BASED_OUTPATIENT_CLINIC_OR_DEPARTMENT_OTHER): Payer: Medicaid Other

## 2018-11-27 ENCOUNTER — Encounter (HOSPITAL_BASED_OUTPATIENT_CLINIC_OR_DEPARTMENT_OTHER): Payer: Self-pay

## 2018-11-27 ENCOUNTER — Other Ambulatory Visit: Payer: Self-pay

## 2018-11-27 ENCOUNTER — Emergency Department (HOSPITAL_BASED_OUTPATIENT_CLINIC_OR_DEPARTMENT_OTHER)
Admission: EM | Admit: 2018-11-27 | Discharge: 2018-11-27 | Disposition: A | Discharge status: Home / Self Care | Payer: Medicaid Other | Attending: Emergency Medicine | Admitting: Emergency Medicine

## 2018-11-27 DIAGNOSIS — Y939 Activity, unspecified: Secondary | ICD-10-CM | POA: Diagnosis not present

## 2018-11-27 DIAGNOSIS — Y92009 Unspecified place in unspecified non-institutional (private) residence as the place of occurrence of the external cause: Secondary | ICD-10-CM | POA: Diagnosis not present

## 2018-11-27 DIAGNOSIS — Z23 Encounter for immunization: Secondary | ICD-10-CM | POA: Diagnosis not present

## 2018-11-27 DIAGNOSIS — S0083XA Contusion of other part of head, initial encounter: Secondary | ICD-10-CM | POA: Diagnosis not present

## 2018-11-27 DIAGNOSIS — Z87891 Personal history of nicotine dependence: Secondary | ICD-10-CM | POA: Diagnosis not present

## 2018-11-27 DIAGNOSIS — Y999 Unspecified external cause status: Secondary | ICD-10-CM | POA: Insufficient documentation

## 2018-11-27 DIAGNOSIS — Z91010 Allergy to peanuts: Secondary | ICD-10-CM | POA: Diagnosis not present

## 2018-11-27 DIAGNOSIS — M25512 Pain in left shoulder: Secondary | ICD-10-CM | POA: Insufficient documentation

## 2018-11-27 DIAGNOSIS — T07XXXA Unspecified multiple injuries, initial encounter: Secondary | ICD-10-CM

## 2018-11-27 DIAGNOSIS — R0789 Other chest pain: Secondary | ICD-10-CM | POA: Insufficient documentation

## 2018-11-27 DIAGNOSIS — S0990XA Unspecified injury of head, initial encounter: Secondary | ICD-10-CM | POA: Diagnosis present

## 2018-11-27 DIAGNOSIS — S50811A Abrasion of right forearm, initial encounter: Secondary | ICD-10-CM | POA: Diagnosis not present

## 2018-11-27 MED ORDER — LORAZEPAM 1 MG PO TABS: 1.0000 mg | ORAL_TABLET | Freq: Once | ORAL | Status: DC

## 2018-11-27 MED ORDER — NAPROXEN 500 MG PO TABS: 500.0000 mg | ORAL_TABLET | Freq: Two times a day (BID) | ORAL | 0 refills | Status: DC | PRN

## 2018-11-27 MED ORDER — OXYCODONE HCL 5 MG PO TABS: 5.0000 mg | ORAL_TABLET | Freq: Once | ORAL | Status: DC

## 2018-11-27 MED ORDER — METHOCARBAMOL 500 MG PO TABS: 500.0000 mg | ORAL_TABLET | Freq: Two times a day (BID) | ORAL | 0 refills | Status: DC | PRN

## 2018-11-27 MED ORDER — TETANUS-DIPHTH-ACELL PERTUSSIS 5-2.5-18.5 LF-MCG/0.5 IM SUSP
0.5000 mL | Freq: Once | INTRAMUSCULAR | Status: AC
  Administered 2018-11-27: 0.5 mL via INTRAMUSCULAR
  Filled 2018-11-27: qty 0.5

## 2018-11-27 MED ORDER — ACETAMINOPHEN 500 MG PO TABS
1000.0000 mg | ORAL_TABLET | Freq: Once | ORAL | Status: AC
  Administered 2018-11-27: 1000 mg via ORAL
  Filled 2018-11-27: qty 2

## 2018-11-27 MED ORDER — OXYCODONE HCL 5 MG PO TABS
5.0000 mg | ORAL_TABLET | Freq: Once | ORAL | Status: AC | PRN
  Administered 2018-11-27: 5 mg via ORAL
  Filled 2018-11-27: qty 1

## 2018-11-27 MED FILL — METHOCARBAMOL 500 MG TABLET: 500 | 10 days supply | Qty: 20 | Fill #0

## 2018-11-27 MED FILL — NAPROXEN 500 MG TABLET: 500 | 15 days supply | Qty: 30 | Fill #0
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
## 2018-11-27 NOTE — ED Notes (Signed)
Patient transported to X-ray 

## 2018-11-27 NOTE — SANE Note (Signed)
On 11/27/2018, at approximately 1531 hours, a [facility]) Brochure was emailed to [place], ED RN.  The [facility] can assist the patient with counseling services, as well as potential placement/housing/50-B information, etc. should the patient need their services.
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
## 2018-11-27 NOTE — ED Notes (Signed)
[employer] contacted-will send officer
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
## 2018-11-27 NOTE — ED Notes (Signed)
[employer] officer at bedside interviewing pt; lorazepam and oxycodone held at this time until interview is complete. Pt in NAD.

## 2018-11-27 NOTE — ED Notes (Signed)
Pt provided community resources

## 2019-01-24 ENCOUNTER — Ambulatory Visit: Payer: Medicaid Other | Admitting: Obstetrics and Gynecology

## 2019-02-07 ENCOUNTER — Emergency Department (HOSPITAL_BASED_OUTPATIENT_CLINIC_OR_DEPARTMENT_OTHER)
Admission: EM | Admit: 2019-02-07 | Discharge: 2019-02-07 | Disposition: A | Discharge status: Home / Self Care | Payer: Medicaid Other | Attending: Emergency Medicine | Admitting: Emergency Medicine

## 2019-02-07 ENCOUNTER — Other Ambulatory Visit: Payer: Self-pay

## 2019-02-07 ENCOUNTER — Encounter (HOSPITAL_BASED_OUTPATIENT_CLINIC_OR_DEPARTMENT_OTHER): Payer: Self-pay | Admitting: Emergency Medicine

## 2019-02-07 DIAGNOSIS — Z20822 Contact with and (suspected) exposure to covid-19: Secondary | ICD-10-CM

## 2019-02-07 DIAGNOSIS — J069 Acute upper respiratory infection, unspecified: Secondary | ICD-10-CM | POA: Diagnosis not present

## 2019-02-07 DIAGNOSIS — Z20828 Contact with and (suspected) exposure to other viral communicable diseases: Secondary | ICD-10-CM

## 2019-02-07 DIAGNOSIS — Z87891 Personal history of nicotine dependence: Secondary | ICD-10-CM | POA: Insufficient documentation

## 2019-02-07 DIAGNOSIS — R05 Cough: Secondary | ICD-10-CM | POA: Diagnosis present

## 2019-02-07 NOTE — ED Triage Notes (Signed)
Coughing and sneezing x 2 days.

## 2019-02-07 NOTE — Discharge Instructions (Signed)
If you start running a fever he will not be allowed to return to work until you are fever free for at least 3 days

## 2019-02-07 NOTE — ED Provider Notes (Signed)
[facility] EMERGENCY DEPARTMENT Provider Note   CSN: [id number] Arrival date & time: 02/07/19  1247     History   Chief Complaint Chief Complaint  Patient presents with  . Cough    HPI Maria Farmer is a 23 y.o. female.     The history is provided by the patient.  Cough Cough characteristics:  Non-productive Severity:  Moderate Onset quality:  Gradual Duration:  2 days Timing:  Intermittent Progression:  Waxing and waning Chronicity:  New Smoker: no   Context: sick contacts   Context comment:  Her son and daughter have been sick for the last 5 days but she also has been in contact with COVID positive patients at work at [employer] Relieved by:  None tried Worsened by:  Nothing Ineffective treatments:  None tried Associated symptoms: rhinorrhea   Associated symptoms: no chest pain, no chills, no ear pain, no fever, no headaches, no myalgias, no rash, no shortness of breath, no sinus congestion, no sore throat, no weight loss and no wheezing   Risk factors: no recent travel     Past Medical History:  Diagnosis Date  . Cyclic vomiting syndrome   . Depression    "[facility] [employer] [place]"    Patient Active Problem List   Diagnosis Date Noted  . Post-operative state 09/29/2018  . Contraceptive management 09/29/2018  . Post-traumatic hematoma of vulva 08/31/2018    Past Surgical History:  Procedure Laterality Date  . HEMATOMA EVACUATION N/A 08/31/2018   Procedure: EVACUATION HEMATOMA OF LABIA;  Surgeon: Reva BoresPratt, Tanya S, MD;  [facility];  Service: Gynecology;  Laterality: N/A;  . NO PAST SURGERIES       OB History    Gravida  2   Para  1   Term  1   Preterm      AB      Living  1     SAB      TAB      Ectopic      Multiple      Live Births               Home Medications    Prior to Admission medications   Medication Sig Start Date End Date Taking? Authorizing Provider  ferrous sulfate 325 (65 FE) MG  tablet Take 1 tablet (325 mg total) by mouth daily. 11/20/18   Sabas SousBero, Michael M, MD  norelgestromin-ethinyl estradiol (ORTHO EVRA) 150-35 MCG/24HR transdermal patch Place 1 patch onto the skin once a week. 09/29/18   Hermina StaggersErvin, Michael L, MD    Family History Family History  Problem Relation Age of Onset  . Diabetes Mellitus II Maternal Grandmother     Social History Social History   Tobacco Use  . Smoking status: Former Smoker    Packs/day: 0.10    Years: 3.00    Pack years: 0.30    Types: Cigarettes    Quit date: 08/13/2016    Years since quitting: 2.4  . Smokeless tobacco: Never Used  Substance Use Topics  . Alcohol use: Not Currently  . Drug use: Yes    Types: Marijuana     Allergies   Peanuts [peanut oil]   Review of Systems Review of Systems  Constitutional: Negative for chills, fever and weight loss.  HENT: Positive for rhinorrhea. Negative for ear pain and sore throat.   Respiratory: Positive for cough. Negative for shortness of breath and wheezing.   Cardiovascular: Negative for chest pain.  Musculoskeletal: Negative for myalgias.  Skin: Negative for rash.  Neurological: Negative for headaches.  All other systems reviewed and are negative.    Physical Exam Updated Vital Signs BP 112/88 (BP Location: Right Arm)   Pulse 70   Temp 98.2 F (36.8 C) (Oral)   Resp 16   Ht 5\' 3"  (1.6 m)   Wt 52.2 kg   LMP 02/04/2019   SpO2 100%   BMI 20.37 kg/m   Physical Exam Vitals signs and nursing note reviewed.  Constitutional:      General: She is not in acute distress.    Appearance: She is well-developed and normal weight.  HENT:     Head: Normocephalic and atraumatic.     Right Ear: Tympanic membrane normal.     Left Ear: A middle ear effusion is present.     Nose: Mucosal edema present.     Mouth/Throat:     Mouth: Mucous membranes are moist.     Tongue: No lesions.     Palate: No mass and lesions.     Pharynx: Oropharynx is clear.  Eyes:      Conjunctiva/sclera: Conjunctivae normal.     Pupils: Pupils are equal, round, and reactive to light.  Neck:     Musculoskeletal: Normal range of motion and neck supple.  Cardiovascular:     Rate and Rhythm: Normal rate and regular rhythm.     Heart sounds: No murmur.  Pulmonary:     Effort: Pulmonary effort is normal. No respiratory distress.     Breath sounds: Normal breath sounds. No wheezing or rales.  Abdominal:     General: There is no distension.     Palpations: Abdomen is soft.     Tenderness: There is no abdominal tenderness. There is no guarding or rebound.  Musculoskeletal: Normal range of motion.        General: No tenderness.  Skin:    General: Skin is warm and dry.     Findings: No erythema or rash.  Neurological:     General: No focal deficit present.     Mental Status: She is alert and oriented to person, place, and time.  Psychiatric:        Mood and Affect: Mood normal.        Behavior: Behavior normal.        Thought Content: Thought content normal.      ED Treatments / Results  Labs (all labs ordered are listed, but only abnormal results are displayed) Labs Reviewed  NOVEL CORONAVIRUS, NAA (HOSP ORDER, SEND-OUT TO REF LAB; TAT 18-24 HRS)    EKG None  Radiology No results found.  Procedures Procedures (including critical care time)  Medications Ordered in ED Medications - No data to display   Initial Impression / Assessment and Plan / ED Course  I have reviewed the triage vital signs and the nursing notes.  Pertinent labs & imaging results that were available during my care of the patient were reviewed by me and considered in my medical decision making (see chart for details).        [name] was evaluated in Emergency Department on 02/07/2019 for the symptoms described in the history of present illness. She was evaluated in the context of the global COVID-19 pandemic, which necessitated consideration that the patient might be at risk  for infection with the SARS-CoV-2 virus that causes COVID-19. Institutional protocols and algorithms that pertain to the evaluation of patients at risk for COVID-19 are in  a state of rapid change based on information released by regulatory bodies including the CDC and federal and state organizations. These policies and algorithms were followed during the patient's care in the ED.   Final Clinical Impressions(s) / ED Diagnoses   Final diagnoses:  Viral URI with cough  Exposure to Covid-19 Virus    ED Discharge Orders    None       Blanchie Dessert, MD 02/07/19 1731

## 2019-02-09 LAB — NOVEL CORONAVIRUS, NAA (HOSP ORDER, SEND-OUT TO REF LAB; TAT 18-24 HRS): SARS-CoV-2, NAA: NOT DETECTED

## 2019-03-18 ENCOUNTER — Other Ambulatory Visit: Payer: Self-pay

## 2019-03-18 ENCOUNTER — Encounter (HOSPITAL_BASED_OUTPATIENT_CLINIC_OR_DEPARTMENT_OTHER): Payer: Self-pay | Admitting: Emergency Medicine

## 2019-03-18 ENCOUNTER — Emergency Department (HOSPITAL_BASED_OUTPATIENT_CLINIC_OR_DEPARTMENT_OTHER)
Admission: EM | Admit: 2019-03-18 | Discharge: 2019-03-18 | Disposition: A | Discharge status: Home / Self Care | Payer: Medicaid Other | Attending: Emergency Medicine | Admitting: Emergency Medicine

## 2019-03-18 DIAGNOSIS — Z87891 Personal history of nicotine dependence: Secondary | ICD-10-CM | POA: Insufficient documentation

## 2019-03-18 DIAGNOSIS — M542 Cervicalgia: Secondary | ICD-10-CM | POA: Diagnosis present

## 2019-03-18 DIAGNOSIS — M62838 Other muscle spasm: Secondary | ICD-10-CM

## 2019-03-18 DIAGNOSIS — Z79899 Other long term (current) drug therapy: Secondary | ICD-10-CM | POA: Insufficient documentation

## 2019-03-18 MED ORDER — LIDOCAINE 5 % EX PTCH: 1.0000 | MEDICATED_PATCH | CUTANEOUS | 0 refills | Status: DC

## 2019-03-18 MED ORDER — KETOROLAC TROMETHAMINE 15 MG/ML IJ SOLN
15.0000 mg | Freq: Once | INTRAMUSCULAR | Status: AC
  Administered 2019-03-18: 15 mg via INTRAMUSCULAR
  Filled 2019-03-18: qty 1

## 2019-03-18 MED ORDER — METHOCARBAMOL 500 MG PO TABS: 500.0000 mg | ORAL_TABLET | Freq: Three times a day (TID) | ORAL | 0 refills | Status: DC

## 2019-03-18 NOTE — Discharge Instructions (Signed)
You have been diagnosed today with trapezius muscle spasm..  At this time there does not appear to be the presence of an emergent medical condition, however there is always the potential for conditions to change. Please read and follow the below instructions.  Please return to the Emergency Department immediately for any new or worsening symptoms. Please be sure to follow up with your Primary Care Provider within one week regarding your visit today; please call their office to schedule an appointment even if you are feeling better for a follow-up visit. You have been given an NSAID-containing medication called Toradol today.  Do not take the medications including ibuprofen, Aleve, Advil, naproxen or other NSAID-containing medications for the next 2 days.  Please be sure to drink plenty of water over the next few days. You may use the muscle relaxer Robaxin as prescribed to help with your symptoms.  Do not drive or operate heavy machinery while taking Robaxin as it will make you drowsy.  Do not drink alcohol or take other sedating medications while taking Robaxin as this will worsen side effects. You may use the Lidoderm patches given to you today to help with your symptoms.  These patches may be expensive, you may also choose to buy over-the-counter versions at your local pharmacy that may be less expensive. You may also use heating pads and gentle massage to help with your muscle spasm.  Get help right away if: You have a new injury and your pain is worse or different. You feel numb or you have tingling in the painful area. You have a headache or vision changes You have fever or chills You lose control of your bowel or bladder (you pee or poop on your self) You have difficulty speaking or confusion You have any new/concerning or worsening symptoms  Please read the additional information packets attached to your discharge summary.  Do not take your medicine if  develop an itchy rash, swelling  in your mouth or lips, or difficulty breathing; call 911 and seek immediate emergency medical attention if this occurs.  Note: Portions of this text may have been transcribed using voice recognition software. Every effort was made to ensure accuracy; however, inadvertent computerized transcription errors may still be present.

## 2019-03-18 NOTE — ED Provider Notes (Signed)
[facility] EMERGENCY DEPARTMENT Provider Note   CSN: [id number] Arrival date & time: 03/18/19  3546     History   Chief Complaint Chief Complaint  Patient presents with  . Neck Pain    HPI Maria Farmer is a 23 y.o. female presents today for left-sided neck pain that began 3 days ago.  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  She denies any clear inciting factor for her pain.  She reports that 3 days ago when she got out of bed she had a "crick" in the left side of her neck.  She describes a moderate intensity sharp throbbing pain of the left trapezius muscle constant worsened with movement of the shoulder and turning the head to the left.  No clear alleviating factors, no medications prior to arrival.  Patient denies fever/chills, fall/injury, vision changes, difficulty swallowing, sore throat, numbness/tingling, weakness, back pain, saddle area paresthesias, bowel/bladder incontinence, chest pain/shortness of breath, nausea/vomiting, abdominal pain or any additional concerns today.  Of note patient reports that she finished her menstrual cycle 2 days ago and would not like a pregnancy test today.     HPI  Past Medical History:  Diagnosis Date  . Cyclic vomiting syndrome   . Depression    "went to [facility] [place]"    Patient Active Problem List   Diagnosis Date Noted  . Post-operative state 09/29/2018  . Contraceptive management 09/29/2018  . Post-traumatic hematoma of vulva 08/31/2018    Past Surgical History:  Procedure Laterality Date  . HEMATOMA EVACUATION N/A 08/31/2018   Procedure: EVACUATION HEMATOMA OF LABIA;  Surgeon: Reva Bores, MD;  Location: [facility] OR;  Service: Gynecology;  Laterality: N/A;  . NO PAST SURGERIES       OB History    Gravida  2   Para  1   Term  1   Preterm      AB      Living  1     SAB      TAB      Ectopic      Multiple      Live Births               Home Medications    Prior to  Admission medications   Medication Sig Start Date End Date Taking? Authorizing Provider  ferrous sulfate 325 (65 FE) MG tablet Take 1 tablet (325 mg total) by mouth daily. 11/20/18   Sabas Sous, MD  lidocaine (LIDODERM) 5 % Place 1 patch onto the skin daily. Remove & Discard patch within 12 hours or as directed by MD 03/18/19   Bill Salinas, PA-C  methocarbamol (ROBAXIN) 500 MG tablet Take 1 tablet (500 mg total) by mouth 3 (three) times daily. 03/18/19   Bill Salinas, PA-C  norelgestromin-ethinyl estradiol (ORTHO EVRA) 150-35 MCG/24HR transdermal patch Place 1 patch onto the skin once a week. 09/29/18   Hermina Staggers, MD    Family History Family History  Problem Relation Age of Onset  . Diabetes Mellitus II Maternal Grandmother     Social History Social History   Tobacco Use  . Smoking status: Former Smoker    Packs/day: 0.10    Years: 3.00    Pack years: 0.30    Types: Cigarettes    Quit date: 08/13/2016    Years since quitting: 2.5  . Smokeless tobacco: Never Used  Substance Use Topics  . Alcohol use: Not Currently  .  Drug use: Yes    Types: Marijuana     Allergies   Peanuts [peanut oil]   Review of Systems Review of Systems Ten systems are reviewed and are negative for acute change except as noted in the HPI   Physical Exam Updated Vital Signs BP 111/72 (BP Location: Right Arm)   Pulse 69   Temp 98.1 F (36.7 C) (Oral)   Resp 16   Ht 5\' 3"  (1.6 m)   Wt 53.5 kg   SpO2 100%   BMI 20.90 kg/m   Physical Exam Constitutional:      General: She is not in acute distress.    Appearance: Normal appearance. She is well-developed. She is not ill-appearing or diaphoretic.  HENT:     Head: Normocephalic and atraumatic. No raccoon eyes or Battle's sign.     Jaw: There is normal jaw occlusion. No trismus.     Comments: No temporal tenderness to palpation    Right Ear: External ear normal.     Left Ear: External ear normal.     Nose: Nose normal. No  rhinorrhea.     Mouth/Throat:     Mouth: Mucous membranes are moist.     Pharynx: Oropharynx is clear. Uvula midline.  Eyes:     General: Vision grossly intact. Gaze aligned appropriately.     Extraocular Movements: Extraocular movements intact.     Conjunctiva/sclera: Conjunctivae normal.     Pupils: Pupils are equal, round, and reactive to light.     Comments: Visual fields grossly intact bilaterally  Neck:     Musculoskeletal: Neck supple. Muscular tenderness present. No spinous process tenderness.     Trachea: Trachea and phonation normal. No tracheal deviation.      Comments: Tenderness of the left trapezius muscle with palpable muscle spasm.  Increased pain with rotation of the head, when patient moves slowly she has appropriate range of motion. Cardiovascular:     Rate and Rhythm: Normal rate and regular rhythm.     Pulses:          Radial pulses are 2+ on the right side and 2+ on the left side.  Pulmonary:     Effort: Pulmonary effort is normal. No respiratory distress.  Abdominal:     General: There is no distension.     Palpations: Abdomen is soft.     Tenderness: There is no abdominal tenderness. There is no guarding or rebound.  Musculoskeletal: Normal range of motion.     Comments: No midline C/T/L spinal tenderness to palpation, no paraspinal muscle tenderness, no deformity, crepitus, or step-off noted. No sign of injury to the neck or back.  Skin:    General: Skin is warm and dry.  Neurological:     Mental Status: She is alert.     GCS: GCS eye subscore is 4. GCS verbal subscore is 5. GCS motor subscore is 6.     Comments: Speech is clear and goal oriented, follows commands Major Cranial nerves without deficit, no facial droop Normal strength in upper and lower extremities bilaterally including dorsiflexion and plantar flexion, strong and equal grip strength Sensation normal to light touch Moves extremities without ataxia, coordination intact Normal gait   Psychiatric:        Behavior: Behavior normal.    ED Treatments / Results  Labs (all labs ordered are listed, but only abnormal results are displayed) Labs Reviewed - No data to display  EKG None  Radiology No results found.  Procedures Procedures (including  critical care time)  Medications Ordered in ED Medications  ketorolac (TORADOL) 15 MG/ML injection 15 mg (has no administration in time range)     Initial Impression / Assessment and Plan / ED Course  I have reviewed the triage vital signs and the nursing notes.  Pertinent labs & imaging results that were available during my care of the patient were reviewed by me and considered in my medical decision making (see chart for details).    Elie ConferDynasia Farmer is a 23 y.o. female who presents to ED for left-sided neck pain.. On exam, patient is with negative NEXUS (no midline spinal tenderness, no focal neuro deficits, no evidence of intoxication is present, normal level of alertness, no painful distracting injury).  She has palpable muscle spasm of the left trapezius muscle that is consistently reproducible.  No neuro deficits on examination and no history of infectious-like symptoms.  Suspect muscular etiology of patient's symptoms today.  Do not suspect meningitis, dissection or other acute pathologies at this time.  Will give prescription for Robaxin and Lidoderm patches.  Patient given 15 mg intramuscular Toradol today for symptomatic relief.  Patient refused pregnancy test, she states understanding of potential risks of medications administered today to possible pregnancy and accepts them.  She also denies history of CKD or gastric ulcers.  Also discussed home symptomatic therapies including massage and heating pads with the patient.  Encourage patient to call primary care provider today to schedule a follow-up appointment.  At this time there does not appear to be any evidence of an acute emergency medical condition and the patient  appears stable for discharge with appropriate outpatient follow up. Diagnosis was discussed with patient who verbalizes understanding of care plan and is agreeable to discharge. I have discussed return precautions with patient who verbalizes understanding of return precautions. Patient encouraged to follow-up with their PCP. All questions answered.  Patient has been discharged in good condition.   Note: Portions of this report may have been transcribed using voice recognition software. Every effort was made to ensure accuracy; however, inadvertent computerized transcription errors may still be present. Final Clinical Impressions(s) / ED Diagnoses   Final diagnoses:  Trapezius muscle spasm    ED Discharge Orders         Ordered    lidocaine (LIDODERM) 5 %  Every 24 hours     03/18/19 0930    methocarbamol (ROBAXIN) 500 MG tablet  3 times daily     03/18/19 0930           Elizabeth PalauMorelli, Dimitria Ketchum A, PA-C 03/18/19 0933    Terrilee FilesButler, Michael C, MD 03/18/19 [phone]

## 2019-03-18 NOTE — ED Triage Notes (Signed)
Pt states there is a "crick" in her neck starting 3 days ago, but is getting worse. She is unable to move her neck without pain.

## 2019-04-14 ENCOUNTER — Other Ambulatory Visit: Payer: Self-pay

## 2019-04-14 ENCOUNTER — Encounter (HOSPITAL_BASED_OUTPATIENT_CLINIC_OR_DEPARTMENT_OTHER): Payer: Self-pay | Admitting: Emergency Medicine

## 2019-04-14 ENCOUNTER — Emergency Department (HOSPITAL_BASED_OUTPATIENT_CLINIC_OR_DEPARTMENT_OTHER)
Admission: EM | Admit: 2019-04-14 | Discharge: 2019-04-14 | Disposition: A | Discharge status: Home / Self Care | Payer: Medicaid Other | Attending: Emergency Medicine | Admitting: Emergency Medicine

## 2019-04-14 DIAGNOSIS — Z91010 Allergy to peanuts: Secondary | ICD-10-CM | POA: Diagnosis not present

## 2019-04-14 DIAGNOSIS — R112 Nausea with vomiting, unspecified: Secondary | ICD-10-CM | POA: Diagnosis present

## 2019-04-14 DIAGNOSIS — Z87891 Personal history of nicotine dependence: Secondary | ICD-10-CM | POA: Insufficient documentation

## 2019-04-14 DIAGNOSIS — R1084 Generalized abdominal pain: Secondary | ICD-10-CM | POA: Insufficient documentation

## 2019-04-14 DIAGNOSIS — Z79899 Other long term (current) drug therapy: Secondary | ICD-10-CM | POA: Diagnosis not present

## 2019-04-14 DIAGNOSIS — F1020 Alcohol dependence, uncomplicated: Secondary | ICD-10-CM | POA: Diagnosis not present

## 2019-04-14 HISTORY — DX: Cannabis use, unspecified with unspecified cannabis-induced disorder: F12.99

## 2019-04-14 HISTORY — DX: Cannabis abuse with unspecified cannabis-induced disorder: F12.19

## 2019-04-14 LAB — CBC WITH DIFFERENTIAL/PLATELET
Abs Immature Granulocytes: 0.01 10*3/uL (ref 0.00–0.07)
Basophils Absolute: 0 10*3/uL (ref 0.0–0.1)
Basophils Relative: 0 %
Eosinophils Absolute: 0 10*3/uL (ref 0.0–0.5)
Eosinophils Relative: 0 %
HCT: 38.9 % (ref 36.0–46.0)
Hemoglobin: 12.2 g/dL (ref 12.0–15.0)
Immature Granulocytes: 0 %
Lymphocytes Relative: 38 %
Lymphs Abs: 2 10*3/uL (ref 0.7–4.0)
MCH: 23.8 pg — ABNORMAL LOW (ref 26.0–34.0)
MCHC: 31.4 g/dL (ref 30.0–36.0)
MCV: 75.8 fL — ABNORMAL LOW (ref 80.0–100.0)
Monocytes Absolute: 0.5 10*3/uL (ref 0.1–1.0)
Monocytes Relative: 9 %
Neutro Abs: 2.8 10*3/uL (ref 1.7–7.7)
Neutrophils Relative %: 53 %
Platelets: 305 10*3/uL (ref 150–400)
RBC: 5.13 MIL/uL — ABNORMAL HIGH (ref 3.87–5.11)
RDW: 16.4 % — ABNORMAL HIGH (ref 11.5–15.5)
WBC: 5.4 10*3/uL (ref 4.0–10.5)
nRBC: 0 % (ref 0.0–0.2)

## 2019-04-14 LAB — COMPREHENSIVE METABOLIC PANEL
ALT: 23 U/L (ref 0–44)
AST: 27 U/L (ref 15–41)
Albumin: 4.4 g/dL (ref 3.5–5.0)
Alkaline Phosphatase: 50 U/L (ref 38–126)
Anion gap: 12 (ref 5–15)
BUN: 9 mg/dL (ref 6–20)
CO2: 21 mmol/L — ABNORMAL LOW (ref 22–32)
Calcium: 9.4 mg/dL (ref 8.9–10.3)
Chloride: 101 mmol/L (ref 98–111)
Creatinine, Ser: 0.85 mg/dL (ref 0.44–1.00)
GFR calc Af Amer: >60 mL/min (ref >60)
GFR calc non Af Amer: >60 mL/min (ref >60)
Glucose, Bld: 103 mg/dL — ABNORMAL HIGH (ref 70–99)
Potassium: 3 mmol/L — ABNORMAL LOW (ref 3.5–5.1)
Sodium: 134 mmol/L — ABNORMAL LOW (ref 135–145)
Total Bilirubin: 0.7 mg/dL (ref 0.3–1.2)
Total Protein: 8.2 g/dL — ABNORMAL HIGH (ref 6.5–8.1)

## 2019-04-14 LAB — URINALYSIS, ROUTINE W REFLEX MICROSCOPIC
Glucose, UA: NEGATIVE mg/dL
Hgb urine dipstick: NEGATIVE
Ketones, ur: 40 mg/dL — AB
Nitrite: NEGATIVE
Protein, ur: NEGATIVE mg/dL
Specific Gravity, Urine: 1.015 (ref 1.005–1.030)
pH: 8 (ref 5.0–8.0)

## 2019-04-14 LAB — LIPASE, BLOOD: Lipase: 27 U/L (ref 11–51)

## 2019-04-14 LAB — URINALYSIS, MICROSCOPIC (REFLEX)

## 2019-04-14 LAB — MAGNESIUM: Magnesium: 2 mg/dL (ref 1.7–2.4)

## 2019-04-14 LAB — PREGNANCY, URINE: Preg Test, Ur: NEGATIVE

## 2019-04-14 MED ORDER — FAMOTIDINE 20 MG PO TABS: 20.0000 mg | ORAL_TABLET | Freq: Two times a day (BID) | ORAL | 0 refills | Status: DC

## 2019-04-14 MED ORDER — FAMOTIDINE IN NACL 20-0.9 MG/50ML-% IV SOLN
20.0000 mg | Freq: Once | INTRAVENOUS | Status: AC
  Administered 2019-04-14: 20 mg via INTRAVENOUS
  Filled 2019-04-14: qty 50

## 2019-04-14 MED ORDER — SODIUM CHLORIDE 0.9 % IV BOLUS
1000.0000 mL | Freq: Once | INTRAVENOUS | Status: AC
  Administered 2019-04-14: 15:00:00 1000 mL via INTRAVENOUS

## 2019-04-14 MED ORDER — LORAZEPAM 2 MG/ML IJ SOLN
1.0000 mg | Freq: Once | INTRAMUSCULAR | Status: AC
  Administered 2019-04-14: 1 mg via INTRAVENOUS
  Filled 2019-04-14: qty 1

## 2019-04-14 MED ORDER — CHLORDIAZEPOXIDE HCL 25 MG PO CAPS: ORAL_CAPSULE | ORAL | 0 refills | Status: DC

## 2019-04-14 MED ORDER — POTASSIUM CHLORIDE CRYS ER 20 MEQ PO TBCR
40.0000 meq | EXTENDED_RELEASE_TABLET | Freq: Once | ORAL | Status: AC
  Administered 2019-04-14: 40 meq via ORAL
  Filled 2019-04-14: qty 2

## 2019-04-14 MED ORDER — POTASSIUM CHLORIDE CRYS ER 20 MEQ PO TBCR: 20.0000 meq | EXTENDED_RELEASE_TABLET | Freq: Every day | ORAL | 0 refills | Status: DC

## 2019-04-14 MED ORDER — POTASSIUM CHLORIDE 10 MEQ/100ML IV SOLN: 10.0000 meq | Freq: Once | INTRAVENOUS | Status: DC

## 2019-04-14 MED ORDER — SODIUM CHLORIDE 0.9 % IV BOLUS
1000.0000 mL | Freq: Once | INTRAVENOUS | Status: AC
  Administered 2019-04-14: 1000 mL via INTRAVENOUS

## 2019-04-14 NOTE — Discharge Instructions (Addendum)
You were seen in the emergency department today for nausea and vomiting with abdominal pain. Your labs show the potassium was low, we are sending you with a potassium supplement. Suspect your symptoms are related to your cyclic vomiting and they also may be due to alcohol withdrawal. We are sending you home with a Librium taper to help with nausea and vomiting and also to help with stopping alcohol use.  Do not drink alcohol when taking Librium as it can be very dangerous and sedating.  Do not drive or operate heavy machinery with this medicine either.  We are sending you home with potassium supplements as well as Pepcid, please take these as prescribed  We have prescribed you new medication(s) today. Discuss the medications prescribed today with your pharmacist as they can have adverse effects and interactions with your other medicines including over the counter and prescribed medications. Seek medical evaluation if you start to experience new or abnormal symptoms after taking one of these medicines, seek care immediately if you start to experience difficulty breathing, feeling of your throat closing, facial swelling, or rash as these could be indications of a more serious allergic reaction  Please stop utilizing marijuana.  Please follow-up with primary care within 48 hours.  Please complete your antibiotics prescribed to her prior ER visit.  Return to the ER for new or worsening symptoms including but not limited to inability to keep fluids down, blood in your stool, fever, passing out, increased pain, or any other concerns.

## 2019-04-14 NOTE — ED Provider Notes (Signed)
[facility] EMERGENCY DEPARTMENT Provider Note   CSN: [id number] Arrival date & time: 04/14/19  1229     History   Chief Complaint Chief Complaint  Patient presents with  . Emesis    HPI Maria Farmer is a 23 y.o. female with a history of cyclic vomiting syndrome, cannabis induced vomiting syndrome, depression, and former tobacco abuse who presents to the emergency department with complaints of nausea/vomiting x 5 days. Patient states she has been having daily TNTC episodes of emesis associated with generalized crampy abdominal pain. Sxs are similar to prior with cannabis use, last used 6-7 days ago. She states that [facility] and given anti-nausea medicine in the ED w/ improvement but when she returned home she started to vomit again. She tried taking the PO phenergan prescribed to her w/o much relief. They found that she had a UTI during her ER visit and she has not been able to take her abx, she was having dysuria but informs me this has pretty much resolved. She feels dehydrated. She also mentions she has been drinking daily, about 1/3rd a bottle of tequila per day, last drink was prior to onset of vomiting, no hx of EtOH withdrawal or DTs. Denies fever, chills, hematemesis, diarrhea, melena, hematochezia, or constipation. Denies vaginal bleeding/discharge. Not concerned for STDs.   HPI  Past Medical History:  Diagnosis Date  . Cannabis-related disorder (HCC)    Cannabis induced vomiting syndrome  . Cyclic vomiting syndrome   . Depression    [facility] [employer]    Patient Active Problem List   Diagnosis Date Noted  . Post-operative state 09/29/2018  . Contraceptive management 09/29/2018  . Post-traumatic hematoma of vulva 08/31/2018    Past Surgical History:  Procedure Laterality Date  . HEMATOMA EVACUATION N/A 08/31/2018   Procedure: EVACUATION HEMATOMA OF LABIA;  Surgeon: Reva BoresPratt, Tanya S, MD;  [facility];  Service: Gynecology;  Laterality:  N/A;  . NO PAST SURGERIES       OB History    Gravida  2   Para  1   Term  1   Preterm      AB      Living  1     SAB      TAB      Ectopic      Multiple      Live Births               Home Medications    Prior to Admission medications   Medication Sig Start Date End Date Taking? Authorizing Provider  ferrous sulfate 325 (65 FE) MG tablet Take 1 tablet (325 mg total) by mouth daily. 11/20/18   Sabas SousBero, Michael M, MD  lidocaine (LIDODERM) 5 % Place 1 patch onto the skin daily. Remove & Discard patch within 12 hours or as directed by MD 03/18/19   Bill SalinasMorelli, Brandon A, PA-C  methocarbamol (ROBAXIN) 500 MG tablet Take 1 tablet (500 mg total) by mouth 3 (three) times daily. 03/18/19   Bill SalinasMorelli, Brandon A, PA-C  norelgestromin-ethinyl estradiol (ORTHO EVRA) 150-35 MCG/24HR transdermal patch Place 1 patch onto the skin once a week. 09/29/18   Hermina StaggersErvin, Michael L, MD    Family History Family History  Problem Relation Age of Onset  . Diabetes Mellitus II Maternal Grandmother     Social History Social History   Tobacco Use  . Smoking status: Former Smoker    Packs/day: 0.10    Years: 3.00  Pack years: 0.30    Types: Cigarettes    Quit date: 08/13/2016    Years since quitting: 2.6  . Smokeless tobacco: Never Used  Substance Use Topics  . Alcohol use: Not Currently  . Drug use: Yes    Types: Marijuana     Allergies   Peanuts [peanut oil]   Review of Systems Review of Systems  Constitutional: Positive for appetite change. Negative for chills and fever.  Respiratory: Negative for shortness of breath.   Cardiovascular: Negative for chest pain.  Gastrointestinal: Positive for abdominal pain, nausea and vomiting. Negative for blood in stool, constipation and diarrhea.  Genitourinary: Negative for dysuria, vaginal bleeding and vaginal discharge.  All other systems reviewed and are negative.    Physical Exam Updated Vital Signs BP (!) 141/71 (BP Location: Right  Arm)   Pulse 71   Temp 99.1 F (37.3 C) (Oral)   Resp 18   Ht 5\' 3"  (1.6 m)   Wt 52.2 kg   LMP 03/24/2019   SpO2 99%   BMI 20.37 kg/m   Physical Exam Vitals signs and nursing note reviewed.  Constitutional:      Appearance: She is well-developed. She is not toxic-appearing.     Comments: Actively vomiting on exam.   HENT:     Head: Normocephalic and atraumatic.     Mouth/Throat:     Mouth: Mucous membranes are dry.  Eyes:     General:        Right eye: No discharge.        Left eye: No discharge.     Conjunctiva/sclera: Conjunctivae normal.  Neck:     Musculoskeletal: Neck supple.  Cardiovascular:     Rate and Rhythm: Normal rate and regular rhythm.  Pulmonary:     Effort: Pulmonary effort is normal. No respiratory distress.     Breath sounds: Normal breath sounds. No wheezing, rhonchi or rales.  Abdominal:     General: There is no distension.     Palpations: Abdomen is soft.     Tenderness: There is no right CVA tenderness, left CVA tenderness, guarding or rebound.     Comments: No focal abdominal tenderness to palpation.   Skin:    General: Skin is warm and dry.     Findings: No rash.  Neurological:     Mental Status: She is alert.     Comments: Clear speech.   Psychiatric:        Behavior: Behavior normal.    ED Treatments / Results  Labs (all labs ordered are listed, but only abnormal results are displayed) Labs Reviewed  COMPREHENSIVE METABOLIC PANEL - Abnormal; Notable for the following components:      Result Value   Sodium 134 (*)    Potassium 3.0 (*)    CO2 21 (*)    Glucose, Bld 103 (*)    Total Protein 8.2 (*)    All other components within normal limits  CBC WITH DIFFERENTIAL/PLATELET - Abnormal; Notable for the following components:   RBC 5.13 (*)    MCV 75.8 (*)    MCH 23.8 (*)    RDW 16.4 (*)    All other components within normal limits  URINALYSIS, ROUTINE W REFLEX MICROSCOPIC - Abnormal; Notable for the following components:    Bilirubin Urine SMALL (*)    Ketones, ur 40 (*)    Leukocytes,Ua TRACE (*)    All other components within normal limits  URINALYSIS, MICROSCOPIC (REFLEX) - Abnormal; Notable for the following  components:   Bacteria, UA MANY (*)    All other components within normal limits  URINE CULTURE  LIPASE, BLOOD  PREGNANCY, URINE  MAGNESIUM    EKG None  Radiology No results found.  Procedures Procedures (including critical care time)  Medications Ordered in ED Medications  sodium chloride 0.9 % bolus 1,000 mL ( Intravenous Stopped 04/14/19 1501)  LORazepam (ATIVAN) injection 1 mg (1 mg Intravenous Given 04/14/19 1402)  famotidine (PEPCID) IVPB 20 mg premix (0 mg Intravenous Stopped 04/14/19 1503)  sodium chloride 0.9 % bolus 1,000 mL (1,000 mLs Intravenous New Bag/Given 04/14/19 1506)  potassium chloride SA (KLOR-CON) CR tablet 40 mEq (40 mEq Oral Given 04/14/19 1532)     Initial Impression / Assessment and Plan / ED Course  I have reviewed the triage vital signs and the nursing notes.  Pertinent labs & imaging results that were available during my care of the patient were reviewed by me and considered in my medical decision making (see chart for details).   Patient presents to the ED w/ complaints of N/V & abdominal discomfort x 5 days.  She is nontoxic appearing, vitals WNL with the exception of elevated BP- doubt HTN emergency. Exam w/o focal abdominal tenderness or peritoneal signs to indicate acute surgical abdomen. Plan for rehydration & symptomatic control- Trial ativan with Fluids & pepcid.   CBC: No significant anemia/leukocytosis.  CMP: Mild electrolyte abnormalities as above- hypokalemia to be orally replaced, mag added on WNL, no QTc prolongation. Renal function & LFTs preserved.  Lipase: WNL Pregnancy test: Negative UA: ketonuria likely related to her dehydration, many bacteria/trace leuks, no longer having dysuria, sent for culture, has abx to take.  EKG: NSR, no prolonged  QTcs.   --> On re-assessment feeling improved, nausea & abdominal pain resolved, will trial PO challenge.   Patient able to tolerate PO. Repeat abdominal exam remains without peritoneal signs- doubt cholecystitis, appendicitis, perforation, obstruction, pancreatitis, PID, or ectopic pregnancy.  Suspect a degree of her cyclic vomiting syndrome related to cannabis use as well as a degree of alcohol withdrawal, does not appear to require hospitalization. She would like to stop drinking. Will discharge with librium taper- educated on this medicine extensively. Will also provide pepcid & kdur supplement. I discussed results, treatment plan, need for follow-up, and return precautions with the patient. Provided opportunity for questions, patient confirmed understanding and is in agreement with plan.    Findings and plan of care discussed with supervising physician Dr. Clarene Duke who is in agreement.   Final Clinical Impressions(s) / ED Diagnoses   Final diagnoses:  Non-intractable vomiting with nausea, unspecified vomiting type    ED Discharge Orders         Ordered    chlordiazePOXIDE (LIBRIUM) 25 MG capsule     04/14/19 1552    potassium chloride SA (KLOR-CON) 20 MEQ tablet  Daily     04/14/19 1552    famotidine (PEPCID) 20 MG tablet  2 times daily     04/14/19 1552           Farhaan Mabee, Cornelius R, PA-C 04/14/19 1557    Little, Ambrose Finland, MD 04/15/19 0710

## 2019-04-14 NOTE — ED Triage Notes (Signed)
Abdominal pain with N/v for several days secondary to cannabis abuse.

## 2019-04-14 NOTE — ED Notes (Signed)
Spoke with Maggie in lab to add on urine culture and magnesium

## 2019-04-14 NOTE — ED Notes (Signed)
Ice Water given for PO challenge per pts preference.

## 2019-04-15 LAB — URINE CULTURE: Culture: NO GROWTH

## 2020-03-21 ENCOUNTER — Emergency Department (HOSPITAL_BASED_OUTPATIENT_CLINIC_OR_DEPARTMENT_OTHER)
Admission: EM | Admit: 2020-03-21 | Discharge: 2020-03-21 | Disposition: A | Discharge status: Home / Self Care | Payer: 59 | Attending: Emergency Medicine | Admitting: Emergency Medicine

## 2020-03-21 ENCOUNTER — Encounter (HOSPITAL_BASED_OUTPATIENT_CLINIC_OR_DEPARTMENT_OTHER): Payer: Self-pay | Admitting: Emergency Medicine

## 2020-03-21 ENCOUNTER — Other Ambulatory Visit: Payer: Self-pay

## 2020-03-21 DIAGNOSIS — Z5321 Procedure and treatment not carried out due to patient leaving prior to being seen by health care provider: Secondary | ICD-10-CM | POA: Diagnosis not present

## 2020-03-21 DIAGNOSIS — R1030 Lower abdominal pain, unspecified: Secondary | ICD-10-CM | POA: Diagnosis present

## 2020-03-21 LAB — COMPREHENSIVE METABOLIC PANEL
ALT: 20 U/L (ref 0–44)
AST: 26 U/L (ref 15–41)
Albumin: 4.2 g/dL (ref 3.5–5.0)
Alkaline Phosphatase: 40 U/L (ref 38–126)
Anion gap: 10 (ref 5–15)
BUN: 9 mg/dL (ref 6–20)
CO2: 24 mmol/L (ref 22–32)
Calcium: 9.3 mg/dL (ref 8.9–10.3)
Chloride: 104 mmol/L (ref 98–111)
Creatinine, Ser: 0.87 mg/dL (ref 0.44–1.00)
GFR, Estimated: >60 mL/min (ref >60)
Glucose, Bld: 83 mg/dL (ref 70–99)
Potassium: 3.5 mmol/L (ref 3.5–5.1)
Sodium: 138 mmol/L (ref 135–145)
Total Bilirubin: 1 mg/dL (ref 0.3–1.2)
Total Protein: 7.5 g/dL (ref 6.5–8.1)

## 2020-03-21 LAB — LIPASE, BLOOD: Lipase: 27 U/L (ref 11–51)

## 2020-03-21 LAB — CBC
HCT: 38.2 % (ref 36.0–46.0)
Hemoglobin: 13 g/dL (ref 12.0–15.0)
MCH: 29.7 pg (ref 26.0–34.0)
MCHC: 34 g/dL (ref 30.0–36.0)
MCV: 87.4 fL (ref 80.0–100.0)
Platelets: 224 10*3/uL (ref 150–400)
RBC: 4.37 MIL/uL (ref 3.87–5.11)
RDW: 13 % (ref 11.5–15.5)
WBC: 3.9 10*3/uL — ABNORMAL LOW (ref 4.0–10.5)
nRBC: 0 % (ref 0.0–0.2)

## 2020-03-21 NOTE — ED Triage Notes (Signed)
Per EMS pt from work , lower sharp abd pain. Hx ovarian cyst.

## 2020-03-21 NOTE — ED Notes (Signed)
Pt left department per screening staff.

## 2020-08-16 ENCOUNTER — Emergency Department (HOSPITAL_BASED_OUTPATIENT_CLINIC_OR_DEPARTMENT_OTHER): Payer: 59

## 2020-08-16 ENCOUNTER — Emergency Department (HOSPITAL_BASED_OUTPATIENT_CLINIC_OR_DEPARTMENT_OTHER)
Admission: EM | Admit: 2020-08-16 | Discharge: 2020-08-16 | Disposition: A | Discharge status: Home / Self Care | Payer: 59 | Attending: Emergency Medicine | Admitting: Emergency Medicine

## 2020-08-16 ENCOUNTER — Other Ambulatory Visit: Payer: Self-pay

## 2020-08-16 ENCOUNTER — Encounter (HOSPITAL_BASED_OUTPATIENT_CLINIC_OR_DEPARTMENT_OTHER): Payer: Self-pay | Admitting: *Deleted

## 2020-08-16 DIAGNOSIS — T148XXA Other injury of unspecified body region, initial encounter: Secondary | ICD-10-CM

## 2020-08-16 DIAGNOSIS — W25XXXA Contact with sharp glass, initial encounter: Secondary | ICD-10-CM | POA: Insufficient documentation

## 2020-08-16 DIAGNOSIS — Z87891 Personal history of nicotine dependence: Secondary | ICD-10-CM | POA: Diagnosis not present

## 2020-08-16 DIAGNOSIS — S59912A Unspecified injury of left forearm, initial encounter: Secondary | ICD-10-CM | POA: Diagnosis present

## 2020-08-16 DIAGNOSIS — S5012XA Contusion of left forearm, initial encounter: Secondary | ICD-10-CM | POA: Diagnosis not present

## 2020-08-16 DIAGNOSIS — Z91010 Allergy to peanuts: Secondary | ICD-10-CM | POA: Insufficient documentation

## 2020-08-16 MED ORDER — NAPROXEN 375 MG PO TABS: 375.0000 mg | ORAL_TABLET | Freq: Two times a day (BID) | ORAL | 0 refills | Status: DC

## 2020-08-16 NOTE — ED Triage Notes (Signed)
Her left arm was shut inside a sliding glass door. Swelling to her left forearm noted. Radial pulse palpated.

## 2020-08-16 NOTE — Discharge Instructions (Signed)
Get help right away if: Your pain is worse or your pain is not controlled with medicine. Your skin over the hematoma breaks or starts bleeding. Your hematoma is in your chest or abdomen and you have weakness, shortness of breath, or a change in consciousness. You have a hematoma on your scalp that is caused by a fall or injury, and you also have: A headache that gets worse. Trouble speaking or understanding speech. Weakness. Change in alertness or consciousness. 

## 2020-08-16 NOTE — ED Provider Notes (Signed)
[facility] EMERGENCY DEPARTMENT Provider Note   CSN: [id number] Arrival date & time: 08/16/20  1936     History Chief Complaint  Patient presents with  . Arm Injury    Maria Farmer is a 25 y.o. female who presents emergency department chief complaint of left forearm injury.  Patient got her left arm caught in a sliding glass door around 2 PM today.  She complains of pain and swelling in the forearm.  She is difficulty with moving the wrist and pain in the forearm with wrist movement but denies any weakness, numbness or tingling.  She has been taking Tylenol and applying ice without significant relief.  HPI     Past Medical History:  Diagnosis Date  . Cannabis abuse with cannabis-induced disorder (HCC)    N/V  . Cannabis-related disorder (HCC)    Cannabis induced vomiting syndrome  . Cyclic vomiting syndrome   . Depression    "went to [facility] [place] [employer]"    Patient Active Problem List   Diagnosis Date Noted  . Post-operative state 09/29/2018  . Contraceptive management 09/29/2018  . Post-traumatic hematoma of vulva 08/31/2018    Past Surgical History:  Procedure Laterality Date  . HEMATOMA EVACUATION N/A 08/31/2018   Procedure: EVACUATION HEMATOMA OF LABIA;  Surgeon: Reva Bores, MD;  Location: [facility] OR;  Service: Gynecology;  Laterality: N/A;  . NO PAST SURGERIES       OB History    Gravida  2   Para  1   Term  1   Preterm      AB      Living  1     SAB      IAB      Ectopic      Multiple      Live Births              Family History  Problem Relation Age of Onset  . Diabetes Mellitus II Maternal Grandmother     Social History   Tobacco Use  . Smoking status: Former Smoker    Packs/day: 0.10    Years: 3.00    Pack years: 0.30    Types: Cigarettes    Quit date: 08/13/2016    Years since quitting: 4.0  . Smokeless tobacco: Never Used  Vaping Use  . Vaping Use: Never used  Substance Use Topics  . Alcohol use: Not  Currently  . Drug use: Yes    Types: Marijuana    Home Medications Prior to Admission medications   Medication Sig Start Date End Date Taking? Authorizing Provider  chlordiazePOXIDE (LIBRIUM) 25 MG capsule 50mg  PO TID x 1D, then 25-50mg  PO BID X 1D, then 25-50mg  PO QD X 1D 04/14/19   Petrucelli, Samantha R, PA-C  famotidine (PEPCID) 20 MG tablet Take 1 tablet (20 mg total) by mouth 2 (two) times daily. 04/14/19   Petrucelli, Samantha R, PA-C  ferrous sulfate 325 (65 FE) MG tablet Take 1 tablet (325 mg total) by mouth daily. 11/20/18   01/20/19, MD  lidocaine (LIDODERM) 5 % Place 1 patch onto the skin daily. Remove & Discard patch within 12 hours or as directed by MD 03/18/19   05/18/19, PA-C  methocarbamol (ROBAXIN) 500 MG tablet Take 1 tablet (500 mg total) by mouth 3 (three) times daily. 03/18/19   05/18/19, PA-C  norelgestromin-ethinyl estradiol (ORTHO EVRA) 150-35 MCG/24HR transdermal patch Place 1 patch onto the skin once a week. 09/29/18  Hermina Staggers, MD  potassium chloride SA (KLOR-CON) 20 MEQ tablet Take 1 tablet (20 mEq total) by mouth daily. 04/14/19   Petrucelli, Pleas Koch, PA-C    Allergies    Peanuts [peanut oil]  Review of Systems   Review of Systems Ten systems reviewed and are negative for acute change, except as noted in the HPI.   Physical Exam Updated Vital Signs BP 114/61 (BP Location: Right Arm)   Pulse 89   Temp 98 F (36.7 C) (Oral)   Resp 20   Ht 5\' 3"  (1.6 m)   Wt 55.8 kg   LMP 08/09/2020   SpO2 100%   BMI 21.79 kg/m   Physical Exam Vitals and nursing note reviewed.  Constitutional:      General: She is not in acute distress.    Appearance: She is well-developed and well-nourished. She is not diaphoretic.  HENT:     Head: Normocephalic and atraumatic.  Eyes:     General: No scleral icterus.    Conjunctiva/sclera: Conjunctivae normal.  Cardiovascular:     Rate and Rhythm: Normal rate and regular rhythm.     Heart  sounds: Normal heart sounds. No murmur heard. No friction rub. No gallop.   Pulmonary:     Effort: Pulmonary effort is normal. No respiratory distress.     Breath sounds: Normal breath sounds.  Abdominal:     General: Bowel sounds are normal. There is no distension.     Palpations: Abdomen is soft. There is no mass.     Tenderness: There is no abdominal tenderness. There is no guarding.  Musculoskeletal:     Cervical back: Normal range of motion.     Comments: Swelling, tenderness and bruising over the dorsal surface of the left forearm.  Normal movement and strength of the fingers.  2+ radial pulse.  Normal sensation.  Exquisite tenderness over a large hematoma.  Skin:    General: Skin is warm and dry.  Neurological:     Mental Status: She is alert and oriented to person, place, and time.  Psychiatric:        Behavior: Behavior normal.     ED Results / Procedures / Treatments   Labs (all labs ordered are listed, but only abnormal results are displayed) Labs Reviewed - No data to display  EKG None  Radiology DG Forearm Left  Result Date: 08/16/2020 CLINICAL DATA:  Left forearm swelling after it was shut inside a sliding glass door. EXAM: LEFT FOREARM - 2 VIEW COMPARISON:  Left hand elbow x-rays dated November 27, 2018. FINDINGS: There is no evidence of fracture or other focal bone lesions. Partially visualized birth control implant in the medial distal upper arm. Soft tissues are otherwise unremarkable. IMPRESSION: Negative. Electronically Signed   By: November 29, 2018 M.D.   On: 08/16/2020 20:08    Procedures Procedures   Medications Ordered in ED Medications - No data to display  ED Course  I have reviewed the triage vital signs and the nursing notes.  Pertinent labs & imaging results that were available during my care of the patient were reviewed by me and considered in my medical decision making (see chart for details).    MDM Rules/Calculators/A&P                           Patient with left forearm injury.  I ordered and reviewed a left forearm x-ray which shows no acute abnormalities.  Patient given  splint for reduced range of motion of the wrist as there is pain in the hematoma.  Discussed application of ice, NSAIDs and Tylenol for pain relief.  Discussed follow-up.  Patient is otherwise appropriate for discharge at this time Final Clinical Impression(s) / ED Diagnoses Final diagnoses:  Injury of left forearm, initial encounter  Hematoma and contusion    Rx / DC Orders ED Discharge Orders    None       Arthor Captain, PA-C 08/16/20 2205    Pollyann Savoy, MD 08/16/20 [phone]

## 2022-01-14 ENCOUNTER — Other Ambulatory Visit: Payer: Self-pay

## 2022-01-14 ENCOUNTER — Encounter (HOSPITAL_BASED_OUTPATIENT_CLINIC_OR_DEPARTMENT_OTHER): Payer: Self-pay | Admitting: Emergency Medicine

## 2022-01-14 ENCOUNTER — Emergency Department (HOSPITAL_BASED_OUTPATIENT_CLINIC_OR_DEPARTMENT_OTHER)
Admission: EM | Admit: 2022-01-14 | Discharge: 2022-01-14 | Disposition: A | Discharge status: Home / Self Care | Payer: Medicaid Other | Attending: Emergency Medicine | Admitting: Emergency Medicine

## 2022-01-14 DIAGNOSIS — Z91010 Allergy to peanuts: Secondary | ICD-10-CM | POA: Diagnosis not present

## 2022-01-14 DIAGNOSIS — R110 Nausea: Secondary | ICD-10-CM | POA: Diagnosis not present

## 2022-01-14 DIAGNOSIS — Z20822 Contact with and (suspected) exposure to covid-19: Secondary | ICD-10-CM | POA: Diagnosis not present

## 2022-01-14 DIAGNOSIS — Z3490 Encounter for supervision of normal pregnancy, unspecified, unspecified trimester: Secondary | ICD-10-CM

## 2022-01-14 DIAGNOSIS — Z3201 Encounter for pregnancy test, result positive: Secondary | ICD-10-CM | POA: Insufficient documentation

## 2022-01-14 DIAGNOSIS — R519 Headache, unspecified: Secondary | ICD-10-CM | POA: Diagnosis not present

## 2022-01-14 LAB — URINALYSIS, ROUTINE W REFLEX MICROSCOPIC
Bilirubin Urine: NEGATIVE
Glucose, UA: NEGATIVE mg/dL
Hgb urine dipstick: NEGATIVE
Ketones, ur: NEGATIVE mg/dL
Leukocytes,Ua: NEGATIVE
Nitrite: NEGATIVE
Protein, ur: NEGATIVE mg/dL
Specific Gravity, Urine: >=1.03 (ref 1.005–1.030)
pH: 6 (ref 5.0–8.0)

## 2022-01-14 LAB — PREGNANCY, URINE: Preg Test, Ur: POSITIVE — AB

## 2022-01-14 LAB — SARS CORONAVIRUS 2 BY RT PCR: SARS Coronavirus 2 by RT PCR: NEGATIVE

## 2022-01-14 MED ORDER — ONDANSETRON HCL 4 MG PO TABS: 4.0000 mg | ORAL_TABLET | Freq: Four times a day (QID) | ORAL | 0 refills | Status: DC

## 2022-01-14 MED ORDER — ACETAMINOPHEN 325 MG PO TABS
650.0000 mg | ORAL_TABLET | Freq: Once | ORAL | Status: AC
  Administered 2022-01-14: 650 mg via ORAL
  Filled 2022-01-14: qty 2

## 2022-01-14 MED ORDER — ONDANSETRON 4 MG PO TBDP
4.0000 mg | ORAL_TABLET | Freq: Once | ORAL | Status: AC
  Administered 2022-01-14: 4 mg via ORAL
  Filled 2022-01-14: qty 1

## 2022-01-14 NOTE — Discharge Instructions (Addendum)
Your COVID test is negative today.  You were noted to be pregnant.  You should start taking a prenatal and follow-up with your OB/GYN.  Tylenol is safe to take for your headache during pregnancy.  Try to stay hydrated as well.  As requested, Zofran is at your pharmacy.  It was a pleasure to meet you and I hope that you feel better.

## 2022-01-14 NOTE — ED Triage Notes (Signed)
Pt arrives pov, c/o HA and nausea with nasal congestion x 3 days. Denies home tx. Denies fever. Pt aox4, Zenaida Niece -

## 2022-01-14 NOTE — ED Notes (Signed)
ED Provider at bedside. 

## 2022-01-14 NOTE — ED Provider Notes (Signed)
[facility] EMERGENCY DEPARTMENT Provider Note   CSN: [id number] Arrival date & time: 01/14/22  0915     History  Chief Complaint  Patient presents with   Headache    Maria Farmer is a 26 y.o. female presenting with 2 days of nausea and headache.  No vomiting.  No fevers.  No visual disturbance.  No diarrhea.  No sick contacts.  Last menstrual period the beginning of July.  Currently has 3 children and one of them has had a cough for 2 weeks.   Headache      Home Medications Prior to Admission medications   Medication Sig Start Date End Date Taking? Authorizing Provider  chlordiazePOXIDE (LIBRIUM) 25 MG capsule 50mg  PO TID x 1D, then 25-50mg  PO BID X 1D, then 25-50mg  PO QD X 1D 04/14/19   Petrucelli, Samantha R, PA-C  famotidine (PEPCID) 20 MG tablet Take 1 tablet (20 mg total) by mouth 2 (two) times daily. 04/14/19   Petrucelli, Samantha R, PA-C  ferrous sulfate 325 (65 FE) MG tablet Take 1 tablet (325 mg total) by mouth daily. 11/20/18   01/20/19, MD  lidocaine (LIDODERM) 5 % Place 1 patch onto the skin daily. Remove & Discard patch within 12 hours or as directed by MD 03/18/19   05/18/19, PA-C  methocarbamol (ROBAXIN) 500 MG tablet Take 1 tablet (500 mg total) by mouth 3 (three) times daily. 03/18/19   05/18/19 A, PA-C  naproxen (NAPROSYN) 375 MG tablet Take 1 tablet (375 mg total) by mouth 2 (two) times daily with a meal. 08/16/20   10/16/20, PA-C  norelgestromin-ethinyl estradiol (ORTHO EVRA) 150-35 MCG/24HR transdermal patch Place 1 patch onto the skin once a week. 09/29/18   10/01/18, MD  potassium chloride SA (KLOR-CON) 20 MEQ tablet Take 1 tablet (20 mEq total) by mouth daily. 04/14/19   Petrucelli, 13/3/20, PA-C      Allergies    Peanuts [peanut oil]    Review of Systems   Review of Systems  Neurological:  Positive for headaches.    Physical Exam Updated Vital Signs BP 121/65 (BP Location: Left Arm)   Pulse 79    Temp 98.7 F (37.1 C) (Oral)   Resp 19   Ht 5\' 3"  (1.6 m)   Wt 58.5 kg   LMP 12/17/2021   SpO2 100%   BMI 22.85 kg/m  Physical Exam Vitals and nursing note reviewed.  Constitutional:      General: She is not in acute distress.    Appearance: Normal appearance. She is not ill-appearing.  HENT:     Head: Normocephalic and atraumatic.     Mouth/Throat:     Mouth: Mucous membranes are moist.     Pharynx: Oropharynx is clear.  Eyes:     General: No scleral icterus.    Conjunctiva/sclera: Conjunctivae normal.  Cardiovascular:     Rate and Rhythm: Normal rate and regular rhythm.  Pulmonary:     Effort: Pulmonary effort is normal. No respiratory distress.     Breath sounds: No wheezing.  Skin:    General: Skin is warm and dry.     Findings: No rash.  Neurological:     Mental Status: She is alert.  Psychiatric:        Mood and Affect: Mood normal.     ED Results / Procedures / Treatments   Labs (all labs ordered are listed, but only abnormal results are displayed) Labs Reviewed  PREGNANCY,  URINE - Abnormal; Notable for the following components:      Result Value   Preg Test, Ur POSITIVE (*)    All other components within normal limits  SARS CORONAVIRUS 2 BY RT PCR  URINALYSIS, ROUTINE W REFLEX MICROSCOPIC    EKG None  Radiology No results found.  Procedures Procedures   Medications Ordered in ED Medications  acetaminophen (TYLENOL) tablet 650 mg (650 mg Oral Given 01/14/22 0956)  ondansetron (ZOFRAN-ODT) disintegrating tablet 4 mg (4 mg Oral Given 01/14/22 5670)    ED Course/ Medical Decision Making/ A&P                           Medical Decision Making Amount and/or Complexity of Data Reviewed Labs: ordered.  Risk OTC drugs. Prescription drug management.   26 year old female presenting today with headache and nausea.  Differential included viral illness, gastroenteritis, migraine headache in pregnancy  Testing: Pregnancy positive, COVID  -  Treatment: Given Tylenol and Zofran.  We discussed the use of Zofran in pregnancy.  MDM/disposition: I will send the patient Zofran per patient request.  At this time she is showing no signs of miscarriage.  No abdominal pain, cramping or bleeding.  No further work-up for pregnancy is indicated.  She will use Tylenol and Zofran at home and follow-up with an OB/GYN. Final Clinical Impression(s) / ED Diagnoses Final diagnoses:  Pregnancy, unspecified gestational age    Rx / DC Orders ED Discharge Orders          Ordered    ondansetron (ZOFRAN) 4 MG tablet  Every 6 hours        01/14/22 1005           Results and diagnoses were explained to the patient. Return precautions discussed in full. Patient had no additional questions and expressed complete understanding.   This chart was dictated using voice recognition software.  Despite best efforts to proofread,  errors can occur which can change the documentation meaning.    Woodroe Chen 01/14/22 1020    Terrilee Files, MD 01/14/22 [phone]

## 2022-02-13 ENCOUNTER — Encounter (HOSPITAL_COMMUNITY): Payer: Self-pay | Admitting: Obstetrics and Gynecology

## 2022-02-13 ENCOUNTER — Inpatient Hospital Stay (HOSPITAL_COMMUNITY)
Admission: AD | Admit: 2022-02-13 | Discharge: 2022-02-13 | Disposition: A | Discharge status: Home / Self Care | Payer: Medicaid Other | Attending: Obstetrics and Gynecology | Admitting: Obstetrics and Gynecology

## 2022-02-13 DIAGNOSIS — O26891 Other specified pregnancy related conditions, first trimester: Secondary | ICD-10-CM | POA: Diagnosis not present

## 2022-02-13 DIAGNOSIS — Z3A09 9 weeks gestation of pregnancy: Secondary | ICD-10-CM | POA: Insufficient documentation

## 2022-02-13 DIAGNOSIS — O219 Vomiting of pregnancy, unspecified: Secondary | ICD-10-CM | POA: Diagnosis not present

## 2022-02-13 LAB — HCG, QUANTITATIVE, PREGNANCY: hCG, Beta Chain, Quant, S: 83573 m[IU]/mL — ABNORMAL HIGH (ref <5)

## 2022-02-13 LAB — COMPREHENSIVE METABOLIC PANEL
ALT: 25 U/L (ref 0–44)
AST: 28 U/L (ref 15–41)
Albumin: 3.7 g/dL (ref 3.5–5.0)
Alkaline Phosphatase: 34 U/L — ABNORMAL LOW (ref 38–126)
Anion gap: 11 (ref 5–15)
BUN: 8 mg/dL (ref 6–20)
CO2: 21 mmol/L — ABNORMAL LOW (ref 22–32)
Calcium: 9.6 mg/dL (ref 8.9–10.3)
Chloride: 105 mmol/L (ref 98–111)
Creatinine, Ser: 0.78 mg/dL (ref 0.44–1.00)
GFR, Estimated: >60 mL/min (ref >60)
Glucose, Bld: 127 mg/dL — ABNORMAL HIGH (ref 70–99)
Potassium: 3.2 mmol/L — ABNORMAL LOW (ref 3.5–5.1)
Sodium: 137 mmol/L (ref 135–145)
Total Bilirubin: 0.7 mg/dL (ref 0.3–1.2)
Total Protein: 6.9 g/dL (ref 6.5–8.1)

## 2022-02-13 LAB — URINALYSIS, ROUTINE W REFLEX MICROSCOPIC
Bilirubin Urine: NEGATIVE
Glucose, UA: NEGATIVE mg/dL
Hgb urine dipstick: NEGATIVE
Ketones, ur: 80 mg/dL — AB
Nitrite: NEGATIVE
Protein, ur: 100 mg/dL — AB
Specific Gravity, Urine: 1.033 — ABNORMAL HIGH (ref 1.005–1.030)
pH: 6 (ref 5.0–8.0)

## 2022-02-13 LAB — RAPID URINE DRUG SCREEN, HOSP PERFORMED
Amphetamines: NOT DETECTED
Barbiturates: NOT DETECTED
Benzodiazepines: NOT DETECTED
Cocaine: NOT DETECTED
Opiates: NOT DETECTED
Tetrahydrocannabinol: POSITIVE — AB

## 2022-02-13 LAB — CBC
HCT: 33.1 % — ABNORMAL LOW (ref 36.0–46.0)
Hemoglobin: 12.2 g/dL (ref 12.0–15.0)
MCH: 31.8 pg (ref 26.0–34.0)
MCHC: 36.9 g/dL — ABNORMAL HIGH (ref 30.0–36.0)
MCV: 86.2 fL (ref 80.0–100.0)
Platelets: 284 10*3/uL (ref 150–400)
RBC: 3.84 MIL/uL — ABNORMAL LOW (ref 3.87–5.11)
RDW: 11.9 % (ref 11.5–15.5)
WBC: 7.1 10*3/uL (ref 4.0–10.5)
nRBC: 0 % (ref 0.0–0.2)

## 2022-02-13 LAB — TSH: TSH: 0.597 u[IU]/mL (ref 0.350–4.500)

## 2022-02-13 MED ORDER — LACTATED RINGERS IV SOLN
Freq: Once | INTRAVENOUS | Status: AC
  Filled 2022-02-13: qty 10

## 2022-02-13 MED ORDER — HALOPERIDOL LACTATE 5 MG/ML IJ SOLN
2.0000 mg | Freq: Once | INTRAMUSCULAR | Status: AC
Start: 2022-02-13 — End: 2022-02-13
  Administered 2022-02-13: 2 mg via INTRAVENOUS
  Filled 2022-02-13: qty 0.4

## 2022-02-13 MED ORDER — LACTATED RINGERS IV BOLUS
1000.0000 mL | Freq: Once | INTRAVENOUS | Status: AC
  Administered 2022-02-13: 1000 mL via INTRAVENOUS

## 2022-02-13 MED ORDER — DIPHENHYDRAMINE HCL 50 MG/ML IJ SOLN
25.0000 mg | Freq: Once | INTRAMUSCULAR | Status: AC
  Administered 2022-02-13: 25 mg via INTRAVENOUS
  Filled 2022-02-13: qty 1

## 2022-02-13 MED ORDER — PROMETHAZINE HCL 12.5 MG PO TABS: 12.5000 mg | ORAL_TABLET | Freq: Four times a day (QID) | ORAL | 0 refills | Status: DC | PRN

## 2022-02-13 NOTE — Discharge Instructions (Signed)
Vomiting in First Trimester Follow these instructions at home: To help relieve your symptoms, listen to your body. Everyone is different and has different preferences. Find what works best for you. Here are some things you can try to help relieve your symptoms: Meals and snacks Eat 5-6 small meals daily instead of 3 large meals. Eating small meals and snacks can help you avoid an empty stomach. Before getting out of bed, eat a couple of crackers to avoid moving around on an empty stomach. Eat a protein-rich snack before bed. Examples include cheese and crackers, or a peanut butter sandwich made with 1 slice of whole-wheat bread and 1 tsp (5 g) of peanut butter. Eat and drink slowly. Try eating starchy foods as these are usually tolerated well. Examples include cereal, toast, bread, potatoes, pasta, rice, and pretzels. Eat at least one serving of protein with your meals and snacks. Protein options include lean meats, poultry, seafood, beans, nuts, nut butters, eggs, cheese, and yogurt. Eat or suck on things that have ginger in them. It may help to relieve nausea. Add  tsp (0.44 g) ground ginger to hot tea, or choose ginger tea.   Fluids It is important to stay hydrated. Try to: Drink small amounts of fluids often. Drink fluids 30 minutes before or after a meal to help lessen the feeling of a full stomach. Drink 100% fruit juice or an electrolyte drink. An electrolyte drink contains sodium, potassium, and chloride. Drink fluids that are cold, clear, and carbonated or sour. These include lemonade, ginger ale, lemon-lime soda, ice water, and sparkling water. Things to avoid Avoid the following: Eating foods that trigger your symptoms. These may include spicy foods, coffee, high-fat foods, very sweet foods, and acidic foods. Drinking more than 1 cup of fluid at a time. Skipping meals. Nausea can be more intense on an empty stomach. If you cannot tolerate food, do not force it. Try sucking on ice  chips or other frozen items and make up for missed calories later. Lying down within 2 hours after eating. Being exposed to environmental triggers. These may include food smells, smoky rooms, closed spaces, rooms with strong smells, warm or humid places, overly loud and noisy rooms, and rooms with motion or flickering lights. Try eating meals in a well-ventilated area that is free of strong smells. Making quick and sudden changes in your movement. Taking iron pills and multivitamins that contain iron. If you take prescription iron pills, do not stop taking them unless your health care provider approves. Preparing food. The smell of food can spoil your appetite or trigger nausea. General instructions Brush your teeth or use a mouth rinse after meals. Take over-the-counter and prescription medicines only as told by your health care provider. Follow instructions from your health care provider about eating or drinking restrictions. Talk with your health care provider about starting a supplement of vitamin B6. Continue to take your prenatal vitamins as told by your health care provider. If you are having trouble taking your prenatal vitamins, talk with your health care provider about other options. Keep all follow-up visits. This is important. Follow-up visits include prenatal visits. Contact a health care provider if: You have pain in your abdomen. You have a severe headache. You have vision problems. You are losing weight. You feel weak or dizzy. You cannot eat or drink without vomiting, especially if this goes on for a full day. Get help right away if: You cannot drink fluids without vomiting. You vomit blood. You have constant   nausea and vomiting. You are very weak. You faint. You have a fever and your symptoms suddenly get worse. Summary Making some changes to your eating habits may help relieve nausea and vomiting. This condition may be managed with lifestyle changes and medicines as  prescribed by your health care provider. If medicines do not help relieve nausea and vomiting, you may need to receive fluids through an IV at the hospital. This information is not intended to replace advice given to you by your health care provider. Make sure you discuss any questions you have with your health care provider. Document Revised: 12/21/2019 Document Reviewed: 12/21/2019 Elsevier Patient Education  2021 Elsevier Inc.  

## 2022-02-13 NOTE — MAU Provider Note (Addendum)
History     CSN: [id number]  Arrival date and time: 02/13/22 1840  Event Date/Time  First Provider Initiated Contact with Patient 02/13/22 1920     Chief Complaint  Patient presents with   Nausea   Loss of Consciousness   HPI Maria Farmer is a 26 y.o. [id number] in early pregnancy who presents to MAU via EMS with chief complaint of recurrent vomiting x 2 days. She is unable to tolerate anything PO. She states she does not have antiemetics at home.   Patient also reports recurrent syncope, onset with pregnancy. Not preceded by pain, aura or consistent precipitating factors. Typically happens at work, she wakes up on the floor. She states EMS is called, they give her IV fluids but do not necessarily take her to the hospital.   Patient reports THC use, previously daily use prior to pregnancy but is trying to reduce or eliminate use. Most recent use was 4 days ago. Patient states she gets her marijuana from a friend but does not know where he gets it.   Patient does not have a prenatal care provider. She states she planned to use Dr. Shawnie Pons but his office does not accept Medicaid so she is looking for a new OB provider.  OB History     Gravida  6   Para  3   Term  2   Preterm  1   AB  1   Living  3      SAB      IAB      Ectopic      Multiple      Live Births  3           Past Medical History:  Diagnosis Date   Cannabis abuse with cannabis-induced disorder (HCC)    N/V   Cannabis-related disorder (HCC)    Cannabis induced vomiting syndrome   Cyclic vomiting syndrome    Depression    "went to [facility] in [place]"    Past Surgical History:  Procedure Laterality Date   HEMATOMA EVACUATION N/A 08/31/2018   Procedure: EVACUATION HEMATOMA OF LABIA;  Surgeon: Reva Bores, MD;  Location[facility];  Service: Gynecology;  Laterality: N/A;   NO PAST SURGERIES      Family History  Problem Relation Age of Onset   Diabetes Mellitus II Maternal Grandmother      Social History   Tobacco Use   Smoking status: Former    Packs/day: 0.10    Years: 3.00    Total pack years: 0.30    Types: Cigarettes    Quit date: 08/13/2016    Years since quitting: 5.5   Smokeless tobacco: Never  Vaping Use   Vaping Use: Never used  Substance Use Topics   Alcohol use: Not Currently   Drug use: Not Currently    Types: Marijuana    Comment: last used 02/09/22    Allergies:  Allergies  Allergen Reactions   Peanuts [Peanut Oil] Anaphylaxis    Medications Prior to Admission  Medication Sig Dispense Refill Last Dose   chlordiazePOXIDE (LIBRIUM) 25 MG capsule 50mg  PO TID x 1D, then 25-50mg  PO BID X 1D, then 25-50mg  PO QD X 1D 10 capsule 0 Unknown   famotidine (PEPCID) 20 MG tablet Take 1 tablet (20 mg total) by mouth 2 (two) times daily. 15 tablet 0 Unknown   ferrous sulfate 325 (65 FE) MG tablet Take 1 tablet (325 mg total) by mouth daily. 30 tablet 1 Unknown  lidocaine (LIDODERM) 5 % Place 1 patch onto the skin daily. Remove & Discard patch within 12 hours or as directed by MD 30 patch 0 Unknown   methocarbamol (ROBAXIN) 500 MG tablet Take 1 tablet (500 mg total) by mouth 3 (three) times daily. 21 tablet 0 Unknown   naproxen (NAPROSYN) 375 MG tablet Take 1 tablet (375 mg total) by mouth 2 (two) times daily with a meal. 20 tablet 0 Unknown   norelgestromin-ethinyl estradiol (ORTHO EVRA) 150-35 MCG/24HR transdermal patch Place 1 patch onto the skin once a week. 3 patch 12 Unknown   ondansetron (ZOFRAN) 4 MG tablet Take 1 tablet (4 mg total) by mouth every 6 (six) hours. 12 tablet 0 Unknown   potassium chloride SA (KLOR-CON) 20 MEQ tablet Take 1 tablet (20 mEq total) by mouth daily. 3 tablet 0 Unknown    Review of Systems  Constitutional:  Positive for fatigue.  Gastrointestinal:  Positive for nausea and vomiting. Negative for abdominal pain.  Neurological:  Positive for dizziness and syncope.  All other systems reviewed and are negative.  Physical Exam    Blood pressure 128/77, pulse 60, temperature 99.7 F (37.6 C), temperature source Axillary, resp. rate 18, height 5\' 3"  (1.6 m), last menstrual period 01/07/2022, SpO2 100 %.  Physical Exam Vitals and nursing note reviewed. Exam conducted with a chaperone present.  Constitutional:      Appearance: Normal appearance. She is ill-appearing.  Cardiovascular:     Rate and Rhythm: Normal rate and regular rhythm.     Pulses: Normal pulses.     Heart sounds: Normal heart sounds.  Pulmonary:     Effort: Pulmonary effort is normal.     Breath sounds: Normal breath sounds.  Abdominal:     General: Abdomen is flat. Bowel sounds are normal.     Tenderness: There is no abdominal tenderness. There is left CVA tenderness. There is no right CVA tenderness.  Skin:    Capillary Refill: Capillary refill takes less than 2 seconds.  Neurological:     Mental Status: She is alert and oriented to person, place, and time.  Psychiatric:        Mood and Affect: Mood normal.        Behavior: Behavior normal.        Thought Content: Thought content normal.        Judgment: Judgment normal.     MAU Course  Procedures  MDM --EMR reviewed. ED documentation of syncopal event x 1 11/22/2018. No documentation of assessment by Neurology or imaging. Will page Neuro for opinion on emergent imaging vs outpatient assessment. Neuro paged at 2030 and 2100 with no callback.  --NSR on ECG. Reviewed by Dr. [id number]. --Patient with loud crying on arrival to MAU. Not actively seizing but demonstrating rapid breathing and poor focus suspicious for anxiety attack. No change in mental status, no syncope. Patient assisted to bed by MAU RN x 2 and CNM. A&4, intact neuro exam.  --2100: Live IUP with cardiac flicker visaulized on BSUS. Patient endorses feeling better.  Patient Vitals for the past 24 hrs:  BP Temp Temp src Pulse Resp SpO2 Height  02/13/22 1916 -- -- -- 60 -- -- --  02/13/22 1909 -- -- -- -- -- 100 % --   02/13/22 1849 128/77 99.7 F (37.6 C) Axillary (!) 159 18 100 % 5\' 3"  (1.6 m)   Results for orders placed or performed during the hospital encounter of 02/13/22 (from the past 24 hour(s))  CBC  Status: Abnormal   Collection Time: 02/13/22  7:08 PM  Result Value Ref Range   WBC 7.1 4.0 - 10.5 K/uL   RBC 3.84 (L) 3.87 - 5.11 MIL/uL   Hemoglobin 12.2 12.0 - 15.0 g/dL   HCT 18.2 (L) 99.3 - 71.6 %   MCV 86.2 80.0 - 100.0 fL   MCH 31.8 26.0 - 34.0 pg   MCHC 36.9 (H) 30.0 - 36.0 g/dL   RDW 96.7 89.3 - 81.0 %   Platelets 284 150 - 400 K/uL   nRBC 0.0 0.0 - 0.2 %  Comprehensive metabolic panel     Status: Abnormal   Collection Time: 02/13/22  7:08 PM  Result Value Ref Range   Sodium 137 135 - 145 mmol/L   Potassium 3.2 (L) 3.5 - 5.1 mmol/L   Chloride 105 98 - 111 mmol/L   CO2 21 (L) 22 - 32 mmol/L   Glucose, Bld 127 (H) 70 - 99 mg/dL   BUN 8 6 - 20 mg/dL   Creatinine, Ser 1.75 0.44 - 1.00 mg/dL   Calcium 9.6 8.9 - 10.2 mg/dL   Total Protein 6.9 6.5 - 8.1 g/dL   Albumin 3.7 3.5 - 5.0 g/dL   AST 28 15 - 41 U/L   ALT 25 0 - 44 U/L   Alkaline Phosphatase 34 (L) 38 - 126 U/L   Total Bilirubin 0.7 0.3 - 1.2 mg/dL   GFR, Estimated >58 >52 mL/min   Anion gap 11 5 - 15  hCG, quantitative, pregnancy     Status: Abnormal   Collection Time: 02/13/22  7:08 PM  Result Value Ref Range   hCG, Beta Chain, Quant, S 83,573 (H) <5 mIU/mL  TSH     Status: None   Collection Time: 02/13/22  7:08 PM  Result Value Ref Range   TSH 0.597 0.350 - 4.500 uIU/mL  Urinalysis, Routine w reflex microscopic Urine, Clean Catch     Status: Abnormal   Collection Time: 02/13/22  9:10 PM  Result Value Ref Range   Color, Urine AMBER (A) YELLOW   APPearance HAZY (A) CLEAR   Specific Gravity, Urine 1.033 (H) 1.005 - 1.030   pH 6.0 5.0 - 8.0   Glucose, UA NEGATIVE NEGATIVE mg/dL   Hgb urine dipstick NEGATIVE NEGATIVE   Bilirubin Urine NEGATIVE NEGATIVE   Ketones, ur 80 (A) NEGATIVE mg/dL   Protein, ur 778 (A)  NEGATIVE mg/dL   Nitrite NEGATIVE NEGATIVE   Leukocytes,Ua SMALL (A) NEGATIVE   RBC / HPF 6-10 0 - 5 RBC/hpf   WBC, UA 11-20 0 - 5 WBC/hpf   Bacteria, UA FEW (A) NONE SEEN   Squamous Epithelial / LPF 6-10 0 - 5   Mucus PRESENT    Meds ordered this encounter  Medications   lactated ringers bolus 1,000 mL   diphenhydrAMINE (BENADRYL) injection 25 mg   haloperidol lactate (HALDOL) injection 2 mg   M.V.I. Adult (INFUVITE ADULT) 10 mL in lactated ringers 1,000 mL infusion   promethazine (PHENERGAN) 12.5 MG tablet    Sig: Take 1 tablet (12.5 mg total) by mouth every 6 (six) hours as needed for nausea or vomiting.    Dispense:  30 tablet    Refill:  0   Report given to C. Druscilla Brownie, CNM who assumes care of patient at this time.  Clayton Bibles, MSA, MSN, CNM Certified Nurse Midwife, [facility] for [employer], [facility] 02/13/22 9:46 PM  Patient able to tolerate PO fluids and  feeling better in MAU  Assessment and Plan   1. Nausea and vomiting during pregnancy   2. [redacted] weeks gestation of pregnancy    -Discharge home in stable condition -Rx for phenergan sent to pharmacy. Ambulatory referral to neurology placed -First trimester precautions discussed -Patient advised to follow-up with OB to establish prenatal care -Patient may return to MAU as needed or if her condition were to change or worsen  Rolm Bookbinder, CNM. 02/13/22 11:25 PM

## 2022-02-13 NOTE — MAU Note (Signed)
.  Maria Farmer is a 26 y.o. at Unknown here in MAU reporting: n/v and syncopal episodes. Pt. Reports being approx 9 wks no PNC to date. Pt. States she previously admited to [facility] approx week of 8/18 for severe dehydration and was seen by an OB provider.  LMP: unknown- end of August per pt.  Onset of complaint: 2 days  Pain score: 6/10  Vitals:   02/13/22 1849  BP: 128/77  Pulse: (!) 159  Resp: 18  Temp: 99.7 F (37.6 C)  SpO2: 100%      Lab orders placed from triage:

## 2022-02-14 LAB — HEMOGLOBIN A1C
Hgb A1c MFr Bld: 5.4 % (ref 4.8–5.6)
Mean Plasma Glucose: 108.28 mg/dL

## 2022-06-15 ENCOUNTER — Encounter (HOSPITAL_BASED_OUTPATIENT_CLINIC_OR_DEPARTMENT_OTHER): Payer: Self-pay | Admitting: Urology

## 2022-06-15 ENCOUNTER — Other Ambulatory Visit: Payer: Self-pay

## 2022-06-15 DIAGNOSIS — R059 Cough, unspecified: Secondary | ICD-10-CM | POA: Diagnosis present

## 2022-06-15 DIAGNOSIS — Z91010 Allergy to peanuts: Secondary | ICD-10-CM | POA: Insufficient documentation

## 2022-06-15 DIAGNOSIS — J069 Acute upper respiratory infection, unspecified: Secondary | ICD-10-CM | POA: Insufficient documentation

## 2022-06-15 DIAGNOSIS — Z20822 Contact with and (suspected) exposure to covid-19: Secondary | ICD-10-CM | POA: Diagnosis not present

## 2022-06-15 DIAGNOSIS — Z87891 Personal history of nicotine dependence: Secondary | ICD-10-CM | POA: Insufficient documentation

## 2022-06-15 LAB — RESP PANEL BY RT-PCR (RSV, FLU A&B, COVID)  RVPGX2
Influenza A by PCR: NEGATIVE
Influenza B by PCR: NEGATIVE
Resp Syncytial Virus by PCR: NEGATIVE
SARS Coronavirus 2 by RT PCR: NEGATIVE

## 2022-06-15 NOTE — ED Triage Notes (Signed)
Pt states having cough, chills, body aches, sweats, ha that started 2 days ago  BF + for Covid

## 2022-06-16 ENCOUNTER — Emergency Department (HOSPITAL_BASED_OUTPATIENT_CLINIC_OR_DEPARTMENT_OTHER)
Admission: EM | Admit: 2022-06-16 | Discharge: 2022-06-16 | Disposition: A | Discharge status: Home / Self Care | Payer: Medicaid Other | Attending: Emergency Medicine | Admitting: Emergency Medicine

## 2022-06-16 ENCOUNTER — Encounter (HOSPITAL_BASED_OUTPATIENT_CLINIC_OR_DEPARTMENT_OTHER): Payer: Self-pay | Admitting: Emergency Medicine

## 2022-06-16 DIAGNOSIS — J069 Acute upper respiratory infection, unspecified: Secondary | ICD-10-CM

## 2022-06-16 HISTORY — DX: Cannabis use, unspecified, uncomplicated: R11.2

## 2022-06-16 HISTORY — DX: Cannabis use, unspecified, uncomplicated: F12.90

## 2022-06-16 NOTE — ED Provider Notes (Signed)
[facility] Provider Note: [name], MD, FACEP  CSN: [id number] MRN: [id number] ARRIVAL: 06/15/22 at 2132 ROOM: MH11/MH11   CHIEF COMPLAINT  Covid Exposure   HISTORY OF PRESENT ILLNESS  06/16/22 12:41 AM Maria Farmer is a 27 y.o. female with cough, chills, body aches, sweats and headache that started 3 days ago.  She rates the pain in her head as a 6 out of 10.  Her boyfriend is positive for COVID.  She denies significant nasal congestion and states her cough, which is productive, is her principal symptom.   Past Medical History:  Diagnosis Date   Cannabinoid hyperemesis syndrome    Cyclic vomiting syndrome    Depression    "went to [facility] in [place]"    Past Surgical History:  Procedure Laterality Date   HEMATOMA EVACUATION N/A 08/31/2018   Procedure: EVACUATION HEMATOMA OF LABIA;  Surgeon: Reva Bores, MD;  Location: [facility] OR;  Service: Gynecology;  Laterality: N/A;   NO PAST SURGERIES      Family History  Problem Relation Age of Onset   Diabetes Mellitus II Maternal Grandmother     Social History   Tobacco Use   Smoking status: Former    Packs/day: 0.10    Years: 3.00    Total pack years: 0.30    Types: Cigarettes    Quit date: 08/13/2016    Years since quitting: 5.8   Smokeless tobacco: Never  Vaping Use   Vaping Use: Never used  Substance Use Topics   Alcohol use: Not Currently   Drug use: Not Currently    Types: Marijuana    Comment: last used 02/09/22    Prior to Admission medications   Medication Sig Start Date End Date Taking? Authorizing Provider  promethazine (PHENERGAN) 12.5 MG tablet Take 1 tablet (12.5 mg total) by mouth every 6 (six) hours as needed for nausea or vomiting. 02/13/22   Calvert Cantor, CNM    Allergies Peanuts [peanut oil]   REVIEW OF SYSTEMS  Negative except as noted here or in the History of Present Illness.   PHYSICAL EXAMINATION  Initial Vital Signs Blood pressure 110/68, pulse 71, temperature  98.4 F (36.9 C), temperature source Oral, resp. rate 16, height 5\' 3"  (1.6 m), weight 58.5 kg, last menstrual period 05/30/2022, SpO2 100 %, unknown if currently breastfeeding.  Examination General: Well-developed, well-nourished female in no acute distress; appearance consistent with age of record HENT: normocephalic; atraumatic; no pharyngeal erythema or exudate Eyes: pupils equal, round and reactive to light; extraocular muscles intact Neck: supple Heart: regular rate and rhythm Lungs: clear to auscultation bilaterally Abdomen: soft; nondistended; nontender; bowel sounds present Extremities: No deformity; full range of motion Neurologic: Awake, alert and oriented; motor function intact in all extremities and symmetric; no facial droop Skin: Warm and dry Psychiatric: Normal mood and affect   RESULTS  Summary of this visit's results, reviewed and interpreted by myself:   EKG Interpretation  Date/Time:    Ventricular Rate:    PR Interval:    QRS Duration:   QT Interval:    QTC Calculation:   R Axis:     Text Interpretation:         Laboratory Studies: Results for orders placed or performed during the hospital encounter of 06/16/22 (from the past 24 hour(s))  Resp panel by RT-PCR (RSV, Flu A&B, Covid) Anterior Nasal Swab     Status: None   Collection Time: 06/15/22  9:56 PM   Specimen: Anterior  Nasal Swab  Result Value Ref Range   SARS Coronavirus 2 by RT PCR NEGATIVE NEGATIVE   Influenza A by PCR NEGATIVE NEGATIVE   Influenza B by PCR NEGATIVE NEGATIVE   Resp Syncytial Virus by PCR NEGATIVE NEGATIVE   Imaging Studies: No results found.  ED COURSE and MDM  Nursing notes, initial and subsequent vitals signs, including pulse oximetry, reviewed and interpreted by myself.  Vitals:   06/15/22 2146 06/15/22 2147  BP:  110/68  Pulse:  71  Resp:  16  Temp:  98.4 F (36.9 C)  TempSrc:  Oral  SpO2:  100%  Weight: 58.5 kg   Height: 5\' 3"  (1.6 m)    Medications - No  data to display  Presentation is consistent with a viral URI but not one of the viruses tested.  She is using over-the-counter medications and does not desire any prescriptions at this time.  PROCEDURES  Procedures   ED DIAGNOSES     ICD-10-CM   1. Viral URI with cough  J06.9          Tarae Wooden, MD 06/16/22 [phone]

## 2022-06-17 ENCOUNTER — Emergency Department (HOSPITAL_BASED_OUTPATIENT_CLINIC_OR_DEPARTMENT_OTHER)
Admission: EM | Admit: 2022-06-17 | Discharge: 2022-06-17 | Disposition: A | Discharge status: Home / Self Care | Payer: Medicaid Other | Attending: Emergency Medicine | Admitting: Emergency Medicine

## 2022-06-17 ENCOUNTER — Encounter (HOSPITAL_BASED_OUTPATIENT_CLINIC_OR_DEPARTMENT_OTHER): Payer: Self-pay | Admitting: Emergency Medicine

## 2022-06-17 ENCOUNTER — Other Ambulatory Visit: Payer: Self-pay

## 2022-06-17 DIAGNOSIS — J069 Acute upper respiratory infection, unspecified: Secondary | ICD-10-CM | POA: Diagnosis not present

## 2022-06-17 DIAGNOSIS — R059 Cough, unspecified: Secondary | ICD-10-CM | POA: Diagnosis present

## 2022-06-17 DIAGNOSIS — Z1152 Encounter for screening for COVID-19: Secondary | ICD-10-CM | POA: Insufficient documentation

## 2022-06-17 LAB — RESP PANEL BY RT-PCR (RSV, FLU A&B, COVID)  RVPGX2
Influenza A by PCR: NEGATIVE
Influenza B by PCR: NEGATIVE
Resp Syncytial Virus by PCR: NEGATIVE
SARS Coronavirus 2 by RT PCR: NEGATIVE

## 2022-06-17 NOTE — ED Triage Notes (Signed)
Pt requests covid test for work. Home test positive

## 2022-06-17 NOTE — Discharge Instructions (Signed)
You have been seen today for your complaint of upper respiratory infection. Your lab work was negative for flu, COVID and RSV. Home care instructions are as follows:  Drink plenty of fluids Follow up with: Your primary care provider in 1 week Please seek immediate medical care if you develop any of the following symptoms: Feel pain or pressure in your chest. Have trouble breathing. Faint or feel like you will faint. Have severe and persistent vomiting. Feel confused or disoriented. At this time there does not appear to be the presence of an emergent medical condition, however there is always the potential for conditions to change. Please read and follow the below instructions.  Do not take your medicine if  develop an itchy rash, swelling in your mouth or lips, or difficulty breathing; call 911 and seek immediate emergency medical attention if this occurs.  You may review your lab tests and imaging results in their entirety on your MyChart account.  Please discuss all results of fully with your primary care provider and other specialist at your follow-up visit.  Note: Portions of this text may have been transcribed using voice recognition software. Every effort was made to ensure accuracy; however, inadvertent computerized transcription errors may still be present.

## 2022-06-17 NOTE — ED Provider Notes (Signed)
[facility] EMERGENCY DEPARTMENT Provider Note   CSN: [id number] Arrival date & time: 06/17/22  1627     History  Chief Complaint  Patient presents with   Covid Test    Maria Farmer is a 27 y.o. female.  Who presents to the ED for repeat COVID testing.  She states that she was exposed to her significant other who tested positive for COVID 2 days ago.  Since then she has had an intermittent dry cough and scratchy throat.  She took 2 home COVID tests today which were both positive. she presented to work today and was told that she should come to the ED for documentation of COVID and flu exam results.  Denies congestion, rhinorrhea, shortness of breath, chest pain, headache, fevers, chills, body aches.  HPI     Home Medications Prior to Admission medications   Medication Sig Start Date End Date Taking? Authorizing Provider  promethazine (PHENERGAN) 12.5 MG tablet Take 1 tablet (12.5 mg total) by mouth every 6 (six) hours as needed for nausea or vomiting. 02/13/22   Darlina Rumpf, CNM      Allergies    Peanuts [peanut oil]    Review of Systems   Review of Systems  Respiratory:  Positive for cough.   All other systems reviewed and are negative.   Physical Exam Updated Vital Signs BP 100/74   Pulse 72   Temp 98.2 F (36.8 C) (Oral)   Resp 18   LMP 05/30/2022 (Within Days)   SpO2 98%  Physical Exam Vitals and nursing note reviewed.  Constitutional:      General: She is not in acute distress.    Appearance: Normal appearance. She is well-developed. She is not ill-appearing, toxic-appearing or diaphoretic.  HENT:     Head: Normocephalic and atraumatic.  Eyes:     Conjunctiva/sclera: Conjunctivae normal.  Cardiovascular:     Rate and Rhythm: Normal rate and regular rhythm.     Heart sounds: No murmur heard. Pulmonary:     Effort: Pulmonary effort is normal. No respiratory distress.     Breath sounds: Normal breath sounds. No stridor. No wheezing,  rhonchi or rales.  Abdominal:     Palpations: Abdomen is soft.     Tenderness: There is no abdominal tenderness.  Musculoskeletal:        General: No swelling.     Cervical back: Neck supple.  Skin:    General: Skin is warm and dry.     Capillary Refill: Capillary refill takes less than 2 seconds.  Neurological:     Mental Status: She is alert.  Psychiatric:        Mood and Affect: Mood normal.     ED Results / Procedures / Treatments   Labs (all labs ordered are listed, but only abnormal results are displayed) Labs Reviewed  RESP PANEL BY RT-PCR (RSV, FLU A&B, COVID)  [id number]    EKG None  Radiology No results found.  Procedures Procedures    Medications Ordered in ED Medications - No data to display  ED Course/ Medical Decision Making/ A&P                           Medical Decision Making This patient presents to the ED for concern of upper respiratory infection, repeat COVID testing, this involves an extensive number of treatment options  My initial workup includes respiratory panel  Additional history obtained from: Nursing notes from this visit.  Prior ED visit on yesterday for same  I ordered, reviewed and interpreted labs which include: Respiratory panel.  Negative.  Afebrile, hemodynamically stable.  27 year old female presenting to the ED for evaluation of repeat COVID testing.  She did test positive for COVID at home earlier today who presents to the ED at the request of her job.  Her respiratory panel today was negative.  Her physical exam is completely benign.  She has minimal complaints of upper respiratory infection symptoms.  She will be given information regarding symptomatic treatment and encouraged to follow-up with her primary care provider.  She will be given a work note as well.  Stable at discharge.  At this time there does not appear to be any evidence of an acute emergency medical condition and the patient appears stable for discharge with  appropriate outpatient follow up. Diagnosis was discussed with patient who verbalizes understanding of care plan and is agreeable to discharge. I have discussed return precautions with patient who verbalizes understanding. Patient encouraged to follow-up with their PCP within 1 week. All questions answered.  Note: Portions of this report may have been transcribed using voice recognition software. Every effort was made to ensure accuracy; however, inadvertent computerized transcription errors may still be present.         Final Clinical Impression(s) / ED Diagnoses Final diagnoses:  None    Rx / DC Orders ED Discharge Orders     None         Mora Bellman 06/17/22 1946    Terald Sleeper, MD 06/17/22 2352

## 2022-10-05 ENCOUNTER — Encounter (HOSPITAL_BASED_OUTPATIENT_CLINIC_OR_DEPARTMENT_OTHER): Payer: Self-pay

## 2022-10-05 ENCOUNTER — Other Ambulatory Visit: Payer: Self-pay

## 2022-10-05 ENCOUNTER — Emergency Department (HOSPITAL_BASED_OUTPATIENT_CLINIC_OR_DEPARTMENT_OTHER)
Admission: EM | Admit: 2022-10-05 | Discharge: 2022-10-05 | Disposition: A | Discharge status: Home / Self Care | Payer: Medicaid Other | Attending: Emergency Medicine | Admitting: Emergency Medicine

## 2022-10-05 DIAGNOSIS — R1084 Generalized abdominal pain: Secondary | ICD-10-CM | POA: Diagnosis not present

## 2022-10-05 DIAGNOSIS — Z79899 Other long term (current) drug therapy: Secondary | ICD-10-CM | POA: Insufficient documentation

## 2022-10-05 DIAGNOSIS — R112 Nausea with vomiting, unspecified: Secondary | ICD-10-CM | POA: Diagnosis present

## 2022-10-05 LAB — CBC WITH DIFFERENTIAL/PLATELET
Abs Immature Granulocytes: 0.01 10*3/uL (ref 0.00–0.07)
Basophils Absolute: 0 10*3/uL (ref 0.0–0.1)
Basophils Relative: 0 %
Eosinophils Absolute: 0 10*3/uL (ref 0.0–0.5)
Eosinophils Relative: 0 %
HCT: 43.7 % (ref 36.0–46.0)
Hemoglobin: 15.2 g/dL — ABNORMAL HIGH (ref 12.0–15.0)
Immature Granulocytes: 0 %
Lymphocytes Relative: 31 %
Lymphs Abs: 1.5 10*3/uL (ref 0.7–4.0)
MCH: 31.6 pg (ref 26.0–34.0)
MCHC: 34.8 g/dL (ref 30.0–36.0)
MCV: 90.9 fL (ref 80.0–100.0)
Monocytes Absolute: 0.4 10*3/uL (ref 0.1–1.0)
Monocytes Relative: 9 %
Neutro Abs: 2.9 10*3/uL (ref 1.7–7.7)
Neutrophils Relative %: 60 %
Platelets: 213 10*3/uL (ref 150–400)
RBC: 4.81 MIL/uL (ref 3.87–5.11)
RDW: 11.9 % (ref 11.5–15.5)
WBC: 4.9 10*3/uL (ref 4.0–10.5)
nRBC: 0 % (ref 0.0–0.2)

## 2022-10-05 LAB — RAPID URINE DRUG SCREEN, HOSP PERFORMED
Amphetamines: NOT DETECTED
Barbiturates: NOT DETECTED
Benzodiazepines: NOT DETECTED
Cocaine: NOT DETECTED
Opiates: NOT DETECTED
Tetrahydrocannabinol: POSITIVE — AB

## 2022-10-05 LAB — COMPREHENSIVE METABOLIC PANEL
ALT: 30 U/L (ref 0–44)
AST: 32 U/L (ref 15–41)
Albumin: 4.5 g/dL (ref 3.5–5.0)
Alkaline Phosphatase: 48 U/L (ref 38–126)
Anion gap: 10 (ref 5–15)
BUN: 13 mg/dL (ref 6–20)
CO2: 25 mmol/L (ref 22–32)
Calcium: 9.3 mg/dL (ref 8.9–10.3)
Chloride: 101 mmol/L (ref 98–111)
Creatinine, Ser: 0.82 mg/dL (ref 0.44–1.00)
GFR, Estimated: >60 mL/min (ref >60)
Glucose, Bld: 98 mg/dL (ref 70–99)
Potassium: 3.5 mmol/L (ref 3.5–5.1)
Sodium: 136 mmol/L (ref 135–145)
Total Bilirubin: 0.9 mg/dL (ref 0.3–1.2)
Total Protein: 8.3 g/dL — ABNORMAL HIGH (ref 6.5–8.1)

## 2022-10-05 LAB — URINALYSIS, MICROSCOPIC (REFLEX): RBC / HPF: NONE SEEN RBC/hpf (ref 0–5)

## 2022-10-05 LAB — LIPASE, BLOOD: Lipase: 32 U/L (ref 11–51)

## 2022-10-05 LAB — URINALYSIS, ROUTINE W REFLEX MICROSCOPIC
Glucose, UA: NEGATIVE mg/dL
Hgb urine dipstick: NEGATIVE
Ketones, ur: 80 mg/dL — AB
Nitrite: NEGATIVE
Protein, ur: 30 mg/dL — AB
Specific Gravity, Urine: 1.02 (ref 1.005–1.030)
pH: 7 (ref 5.0–8.0)

## 2022-10-05 LAB — PREGNANCY, URINE: Preg Test, Ur: NEGATIVE

## 2022-10-05 MED ORDER — ONDANSETRON HCL 4 MG/2ML IJ SOLN
4.0000 mg | Freq: Once | INTRAMUSCULAR | Status: AC
  Administered 2022-10-05: 4 mg via INTRAVENOUS
  Filled 2022-10-05: qty 2

## 2022-10-05 MED ORDER — PROMETHAZINE HCL 25 MG/ML IJ SOLN
INTRAMUSCULAR | Status: AC
  Filled 2022-10-05: qty 1

## 2022-10-05 MED ORDER — ACETAMINOPHEN 325 MG PO TABS
650.0000 mg | ORAL_TABLET | Freq: Once | ORAL | Status: AC
  Administered 2022-10-05: 650 mg via ORAL
  Filled 2022-10-05: qty 2

## 2022-10-05 MED ORDER — CEPHALEXIN 250 MG PO CAPS: 250.0000 mg | ORAL_CAPSULE | Freq: Four times a day (QID) | ORAL | 0 refills | Status: DC

## 2022-10-05 MED ORDER — PROMETHAZINE HCL 25 MG PO TABS
25.0000 mg | ORAL_TABLET | Freq: Once | ORAL | Status: AC
  Administered 2022-10-05: 25 mg via ORAL
  Filled 2022-10-05: qty 1

## 2022-10-05 MED ORDER — PROMETHAZINE HCL 25 MG PO TABS: 25.0000 mg | ORAL_TABLET | Freq: Four times a day (QID) | ORAL | 0 refills | Status: DC | PRN

## 2022-10-05 MED ORDER — SODIUM CHLORIDE 0.9 % IV BOLUS
1000.0000 mL | Freq: Once | INTRAVENOUS | Status: AC
  Administered 2022-10-05: 1000 mL via INTRAVENOUS

## 2022-10-05 MED ORDER — SODIUM CHLORIDE 0.9 % IV SOLN
25.0000 mg | Freq: Four times a day (QID) | INTRAVENOUS | Status: DC | PRN
  Administered 2022-10-05: 25 mg via INTRAVENOUS
  Filled 2022-10-05: qty 1

## 2022-10-05 MED ORDER — HALOPERIDOL LACTATE 5 MG/ML IJ SOLN
2.5000 mg | Freq: Once | INTRAMUSCULAR | Status: AC
  Administered 2022-10-05: 2.5 mg via INTRAVENOUS
  Filled 2022-10-05: qty 1

## 2022-10-05 NOTE — ED Notes (Signed)
Gave pt [place] ice push pop and encouraged po intake

## 2022-10-05 NOTE — ED Triage Notes (Signed)
Pt states n/v x 3days, lightheaded, called ems this am, told to go to ED for dehydration.  Dry heaves , generalized abdominal pain

## 2022-10-05 NOTE — ED Notes (Signed)
Pt remains nauseous, trying ice chips, unable to take tylenol po

## 2022-10-05 NOTE — ED Provider Notes (Signed)
[place] [facility] Provider Note   CSN: [id number] Arrival date & time: 10/05/22  0840     History  Chief Complaint  Patient presents with   Nausea   Abdominal Pain    Maria Farmer is a 27 y.o. female with a past medical history of cannabinoid hyperemesis syndrome presents today for evaluation of nausea and vomiting.  Patient states she has had nausea and vomiting in the last 3 days.  She reports mild generalized abdominal pain, mostly in the lower abdomen.  She denies any urinary symptoms.  No itching burning with urination, no increased urinary urgency or frequency.  Last menstrual cycle was at the end of last month.  She denies any abnormal vaginal discharge, constipation, diarrhea, blood in her stool or urine.  She denies fever, chest pain or shortness of breath.  No abdominal surgical history.   Abdominal Pain     Past Medical History:  Diagnosis Date   Cannabinoid hyperemesis syndrome    Cyclic vomiting syndrome    Depression    "went to [facility] in [place]"   Past Surgical History:  Procedure Laterality Date   HEMATOMA EVACUATION N/A 08/31/2018   Procedure: EVACUATION HEMATOMA OF LABIA;  Surgeon: Reva Bores, MD;  Location: [facility] OR;  Service: Gynecology;  Laterality: N/A;   NO PAST SURGERIES       Home Medications Prior to Admission medications   Medication Sig Start Date End Date Taking? Authorizing Provider  promethazine (PHENERGAN) 12.5 MG tablet Take 1 tablet (12.5 mg total) by mouth every 6 (six) hours as needed for nausea or vomiting. 02/13/22   Calvert Cantor, CNM      Allergies    Peanuts [peanut oil]    Review of Systems   Review of Systems  Gastrointestinal:  Positive for abdominal pain.    Physical Exam Updated Vital Signs BP 106/63   Pulse 60   Temp 98.5 F (36.9 C) (Oral)   Resp 16   Ht 5\' 2"  (1.575 m)   Wt 56.7 kg   LMP 01/07/2022 (Within Days)   SpO2 100%   BMI 22.86 kg/m  Physical  Exam Vitals and nursing note reviewed.  Constitutional:      Appearance: Normal appearance.  HENT:     Head: Normocephalic and atraumatic.     Mouth/Throat:     Mouth: Mucous membranes are moist.  Eyes:     General: No scleral icterus. Cardiovascular:     Rate and Rhythm: Normal rate and regular rhythm.     Pulses: Normal pulses.     Heart sounds: Normal heart sounds.  Pulmonary:     Effort: Pulmonary effort is normal.     Breath sounds: Normal breath sounds.  Abdominal:     General: Abdomen is flat.     Palpations: Abdomen is soft.     Tenderness: There is abdominal tenderness in the right lower quadrant, suprapubic area and left lower quadrant.  Musculoskeletal:        General: No deformity.  Skin:    General: Skin is warm.     Findings: No rash.  Neurological:     General: No focal deficit present.     Mental Status: She is alert.  Psychiatric:        Mood and Affect: Mood normal.     ED Results / Procedures / Treatments   Labs (all labs ordered are listed, but only abnormal results are displayed) Labs Reviewed - No  data to display  EKG None  Radiology No results found.  Procedures Procedures    Medications Ordered in ED Medications  sodium chloride 0.9 % bolus 1,000 mL (has no administration in time range)  ondansetron (ZOFRAN) injection 4 mg (has no administration in time range)  acetaminophen (TYLENOL) tablet 650 mg (has no administration in time range)    ED Course/ Medical Decision Making/ A&P                             Medical Decision Making Amount and/or Complexity of Data Reviewed Labs: ordered.  Risk OTC drugs. Prescription drug management.   This patient presents to the ED for nausea, vomiting, this involves an extensive number of treatment options, and is a complaint that carries with a high risk of complications and morbidity.  The differential diagnosis includes small bowel obstruction, acute gastroenteritis, appendicitis,  gallbladder/biliary, pancreatitis, peptic ulcer disease, perforation, EtOH intoxication, cannabinoid hyperemesis.  This is not an exhaustive list.  Lab tests: I ordered and personally interpreted labs.  The pertinent results include: WBC unremarkable. Hbg unremarkable. Platelets unremarkable. Electrolytes unremarkable. BUN, creatinine unremarkable.  Urine drug screen positive for THC.  Urinalysis with bacteriuria.  Problem list/ ED course/ Critical interventions/ Medical management: HPI: See above Vital signs within normal range and stable throughout visit. Laboratory/imaging studies significant for: See above. On physical examination, patient is afebrile and appears in no acute distress.This patient with nausea and vomiting which is likely secondary to cannabis hyperemesis syndrome. Considered but low risk for SBO (normal BM, passing flatus, no abdominal surgeries), no signs of DKA in labs. Patient BMP with normal electrolytes and no sign of dehydration causing prerenal AKI. Low suspicion for gastric or esophageal dysmotility as cause. Patient with no chest pain so low suspicion for ACS. Based on history, exam, and work up low suspicion for pancreatitis, appendicitis, biliary pathology, or other emergent problem. Patient given zofran, IV Phenergan and tolerated PO here. Patient to be discharged with Phenergan and to follow up with PCP.  I also sent an Rx of Keflex for UTI based on urinalysis results. I have reviewed the patient home medicines and have made adjustments as needed.  Cardiac monitoring/EKG: The patient was maintained on a cardiac monitor.  I personally reviewed and interpreted the cardiac monitor which showed an underlying rhythm of: sinus rhythm.  Additional history obtained: External records from outside source obtained and reviewed including: Chart review including previous notes, labs, imaging.  Consultations obtained:  Disposition Continued outpatient therapy. Follow-up with  PCP recommended for reevaluation of symptoms. Treatment plan discussed with patient.  Pt acknowledged understanding was agreeable to the plan. Worrisome signs and symptoms were discussed with patient, and patient acknowledged understanding to return to the ED if they noticed these signs and symptoms. Patient was stable upon discharge.   This chart was dictated using voice recognition software.  Despite best efforts to proofread,  errors can occur which can change the documentation meaning.          Final Clinical Impression(s) / ED Diagnoses Final diagnoses:  Nausea and vomiting, unspecified vomiting type    Rx / DC Orders ED Discharge Orders          Ordered    promethazine (PHENERGAN) 25 MG tablet  Every 6 hours PRN        10/05/22 1703    cephALEXin (KEFLEX) 250 MG capsule  4 times daily  10/05/22 1706              Jeanelle Malling, PA 10/05/22 1711    Tegeler, Canary Brim, MD 10/06/22 [phone]

## 2022-10-05 NOTE — ED Notes (Signed)
Pt continues to feel nauseated and is having dry heaves.

## 2022-10-05 NOTE — ED Notes (Signed)
Pt reports she is "feeling sick" again since eating. Primary RN/MD notified

## 2022-10-05 NOTE — ED Notes (Signed)
Pt has tolerated the [place] ice, fortunately no distress

## 2022-10-05 NOTE — Discharge Instructions (Addendum)
Please take your medications as prescribed. Take tylenol/ibuprofen for pain, take Phenergan as needed for nausea/vomiting. I recommend close follow-up with PCP for reevaluation.  Please do not hesitate to return to emergency department if worrisome signs symptoms we discussed become apparent.

## 2022-10-05 NOTE — ED Notes (Signed)
Attempted IV access to left hand, unsuccessful but tol well

## 2022-10-05 NOTE — ED Notes (Signed)
Pt ambulated to bathroom with steady gait.  Nausea and  vomiting unchanged.

## 2022-10-05 NOTE — ED Notes (Signed)
Pt unable to tolerate po, vomited following attempt at taking po phenergan.

## 2022-10-05 NOTE — ED Notes (Signed)
ED Provider at bedside. 

## 2022-10-08 ENCOUNTER — Other Ambulatory Visit: Payer: Self-pay

## 2022-10-08 ENCOUNTER — Encounter (HOSPITAL_BASED_OUTPATIENT_CLINIC_OR_DEPARTMENT_OTHER): Payer: Self-pay | Admitting: Emergency Medicine

## 2022-10-08 ENCOUNTER — Emergency Department (HOSPITAL_BASED_OUTPATIENT_CLINIC_OR_DEPARTMENT_OTHER)
Admission: EM | Admit: 2022-10-08 | Discharge: 2022-10-08 | Disposition: A | Discharge status: Home / Self Care | Payer: Medicaid Other | Attending: Emergency Medicine | Admitting: Emergency Medicine

## 2022-10-08 DIAGNOSIS — R112 Nausea with vomiting, unspecified: Secondary | ICD-10-CM | POA: Diagnosis present

## 2022-10-08 DIAGNOSIS — Z91010 Allergy to peanuts: Secondary | ICD-10-CM | POA: Insufficient documentation

## 2022-10-08 DIAGNOSIS — R6883 Chills (without fever): Secondary | ICD-10-CM | POA: Insufficient documentation

## 2022-10-08 DIAGNOSIS — N390 Urinary tract infection, site not specified: Secondary | ICD-10-CM | POA: Insufficient documentation

## 2022-10-08 LAB — CBC WITH DIFFERENTIAL/PLATELET
Abs Immature Granulocytes: 0.02 10*3/uL (ref 0.00–0.07)
Basophils Absolute: 0 10*3/uL (ref 0.0–0.1)
Basophils Relative: 0 %
Eosinophils Absolute: 0 10*3/uL (ref 0.0–0.5)
Eosinophils Relative: 0 %
HCT: 38.4 % (ref 36.0–46.0)
Hemoglobin: 13.7 g/dL (ref 12.0–15.0)
Immature Granulocytes: 0 %
Lymphocytes Relative: 40 %
Lymphs Abs: 1.9 10*3/uL (ref 0.7–4.0)
MCH: 31.4 pg (ref 26.0–34.0)
MCHC: 35.7 g/dL (ref 30.0–36.0)
MCV: 88.1 fL (ref 80.0–100.0)
Monocytes Absolute: 0.7 10*3/uL (ref 0.1–1.0)
Monocytes Relative: 15 %
Neutro Abs: 2 10*3/uL (ref 1.7–7.7)
Neutrophils Relative %: 45 %
Platelets: 169 10*3/uL (ref 150–400)
RBC: 4.36 MIL/uL (ref 3.87–5.11)
RDW: 11.2 % — ABNORMAL LOW (ref 11.5–15.5)
WBC: 4.7 10*3/uL (ref 4.0–10.5)
nRBC: 0 % (ref 0.0–0.2)

## 2022-10-08 LAB — COMPREHENSIVE METABOLIC PANEL
ALT: 30 U/L (ref 0–44)
AST: 31 U/L (ref 15–41)
Albumin: 3.9 g/dL (ref 3.5–5.0)
Alkaline Phosphatase: 37 U/L — ABNORMAL LOW (ref 38–126)
Anion gap: 7 (ref 5–15)
BUN: 6 mg/dL (ref 6–20)
CO2: 23 mmol/L (ref 22–32)
Calcium: 8.5 mg/dL — ABNORMAL LOW (ref 8.9–10.3)
Chloride: 105 mmol/L (ref 98–111)
Creatinine, Ser: 0.85 mg/dL (ref 0.44–1.00)
GFR, Estimated: >60 mL/min (ref >60)
Glucose, Bld: 93 mg/dL (ref 70–99)
Potassium: 3.5 mmol/L (ref 3.5–5.1)
Sodium: 135 mmol/L (ref 135–145)
Total Bilirubin: 0.9 mg/dL (ref 0.3–1.2)
Total Protein: 7 g/dL (ref 6.5–8.1)

## 2022-10-08 LAB — URINALYSIS, ROUTINE W REFLEX MICROSCOPIC
Glucose, UA: NEGATIVE mg/dL
Hgb urine dipstick: NEGATIVE
Ketones, ur: >=80 mg/dL — AB
Nitrite: NEGATIVE
Protein, ur: 30 mg/dL — AB
Specific Gravity, Urine: 1.02 (ref 1.005–1.030)
pH: 8.5 — ABNORMAL HIGH (ref 5.0–8.0)

## 2022-10-08 LAB — URINALYSIS, MICROSCOPIC (REFLEX): RBC / HPF: NONE SEEN RBC/hpf (ref 0–5)

## 2022-10-08 LAB — PREGNANCY, URINE: Preg Test, Ur: NEGATIVE

## 2022-10-08 LAB — LIPASE, BLOOD: Lipase: 31 U/L (ref 11–51)

## 2022-10-08 MED ORDER — SODIUM CHLORIDE 0.9 % IV BOLUS
1000.0000 mL | Freq: Once | INTRAVENOUS | Status: AC
  Administered 2022-10-08: 1000 mL via INTRAVENOUS

## 2022-10-08 MED ORDER — DROPERIDOL 2.5 MG/ML IJ SOLN
1.2500 mg | Freq: Once | INTRAMUSCULAR | Status: AC
  Administered 2022-10-08: 1.25 mg via INTRAVENOUS
  Filled 2022-10-08: qty 2

## 2022-10-08 MED ORDER — ONDANSETRON HCL 4 MG PO TABS
4.0000 mg | ORAL_TABLET | Freq: Four times a day (QID) | ORAL | 0 refills | Status: AC
  Filled 2022-10-08: qty 12, 3d supply, fill #0

## 2022-10-08 NOTE — ED Triage Notes (Signed)
BIB EMS from home. Seen here Friday for vomiting, prescribed phenergan. C/o continued vomiting, unable to tolerate PO. States was having hallucinations with medication, has stopped meds.   NS given by EMS pta.

## 2022-10-08 NOTE — ED Provider Notes (Signed)
[place] [facility] Provider Note   CSN: [id number] Arrival date & time: 10/08/22  1717     History  Chief Complaint  Patient presents with   Emesis   HPI Maria Farmer is a 27 y.o. female with recent history of cannabinoid hyperemesis syndrome and UTI presenting for emesis.  Started Wednesday of last week.  Was seen for same on Friday.  Was suspected that she likely had cannabinoid hyperemesis syndrome.  Patient does use marijuana frequently.  Sent home with Phenergan.  States she stopped taking the medication because it made her "hallucinate.  States vomiting has been persistent over the weekend.  No blood in the vomit.  Denies bowel changes.  Denies urinary changes.  States she has chills but no fever.  Denies abdominal pain.  Patient also states that she has not been taking her prescribed Keflex for her recent UTI because of all the vomiting.   Emesis      Home Medications Prior to Admission medications   Medication Sig Start Date End Date Taking? Authorizing Provider  ondansetron (ZOFRAN) 4 MG tablet Take 1 tablet (4 mg total) by mouth every 6 (six) hours. 10/08/22  Yes Gareth Eagle, PA-C  cephALEXin (KEFLEX) 250 MG capsule Take 1 capsule (250 mg total) by mouth 4 (four) times daily. 10/05/22   Jeanelle Malling, PA  promethazine (PHENERGAN) 12.5 MG tablet Take 1 tablet (12.5 mg total) by mouth every 6 (six) hours as needed for nausea or vomiting. Patient not taking: Reported on 10/05/2022 02/13/22   Calvert Cantor, CNM  promethazine (PHENERGAN) 25 MG tablet Take 1 tablet (25 mg total) by mouth every 6 (six) hours as needed for nausea or vomiting. 10/05/22   Jeanelle Malling, PA      Allergies    Peanuts [peanut oil]    Review of Systems   Review of Systems  Gastrointestinal:  Positive for vomiting.    Physical Exam   Vitals:   10/08/22 2030 10/08/22 2130  BP: (!) 95/50 (!) 97/55  Pulse: 62 68  Resp:  17  Temp:  98.5 F (36.9 C)  SpO2:  100% 99%    CONSTITUTIONAL:  well-appearing, NAD NEURO:  Alert and oriented x 3, CN 3-12 grossly intact EYES:  eyes equal and reactive ENT/NECK:  Supple, no stridor  CARDIO:  regular rate and rhythm, appears well-perfused  PULM:  No respiratory distress, CTAB GI/GU:  non-distended, soft, non tender MSK/SPINE:  No gross deformities, no edema, moves all extremities  SKIN:  no rash, atraumatic  *Additional and/or pertinent findings included in MDM below  ED Results / Procedures / Treatments   Labs (all labs ordered are listed, but only abnormal results are displayed) Labs Reviewed  CBC WITH DIFFERENTIAL/PLATELET - Abnormal; Notable for the following components:      Result Value   RDW 11.2 (*)    All other components within normal limits  COMPREHENSIVE METABOLIC PANEL - Abnormal; Notable for the following components:   Calcium 8.5 (*)    Alkaline Phosphatase 37 (*)    All other components within normal limits  URINALYSIS, ROUTINE W REFLEX MICROSCOPIC - Abnormal; Notable for the following components:   APPearance HAZY (*)    pH 8.5 (*)    Bilirubin Urine SMALL (*)    Ketones, ur >=80 (*)    Protein, ur 30 (*)    Leukocytes,Ua SMALL (*)    All other components within normal limits  URINALYSIS, MICROSCOPIC (REFLEX) - Abnormal;  Notable for the following components:   Bacteria, UA FEW (*)    All other components within normal limits  LIPASE, BLOOD  PREGNANCY, URINE    EKG None  Radiology No results found.  Procedures Procedures    Medications Ordered in ED Medications  droperidol (INAPSINE) 2.5 MG/ML injection 1.25 mg (1.25 mg Intravenous Given 10/08/22 1924)  sodium chloride 0.9 % bolus 1,000 mL (0 mLs Intravenous Stopped 10/08/22 2028)    ED Course/ Medical Decision Making/ A&P                             Medical Decision Making Amount and/or Complexity of Data Reviewed Labs: ordered.  Risk Prescription drug management.   Initial Impression and  Ddx 27 year old female who is well appearing presenting for emesis.  Exam was unremarkable with a nontender abdomen.  DDx includes cannabinoid hyperemesis syndrome, intra-abdominal infection, dehydration, electrolyte derangement, and pregnancy. Patient PMH that increases complexity of ED encounter:  h/o cannabinoid hyperemesis syndrome and UTI  Interpretation of Diagnostics I independent reviewed and interpreted the labs as followed: Pyuria and bacteriuria   Patient Reassessment and Ultimate Disposition/Management Symptoms improved after treatment.  Have low suspicion for intra-abdominal infection given patient is afebrile, no abdominal tenderness and normal white blood cell count.  Likely cannabinoid hyperemesis syndrome given recent marijuana use.  Urinalysis still concerning for UTI.  Advised patient to continue taking her Keflex.  Sent Zofran to her pharmacy.  Advised her to follow-up with her PCP.  Discussed pertinent return precautions.  Vital stable at discharge.  Patient management required discussion with the following services or consulting groups:  None  Complexity of Problems Addressed Acute complicated illness or Injury  Additional Data Reviewed and Analyzed Further history obtained from: Past medical history and medications listed in the EMR and Prior ED visit notes  Patient Encounter Risk Assessment Prescriptions         Final Clinical Impression(s) / ED Diagnoses Final diagnoses:  Nausea and vomiting, unspecified vomiting type  Urinary tract infection without hematuria, site unspecified    Rx / DC Orders ED Discharge Orders          Ordered    ondansetron (ZOFRAN) 4 MG tablet  Every 6 hours        10/08/22 2145              Gareth Eagle, PA-C 10/08/22 2147    Glyn Ade, MD 10/09/22 1528

## 2022-10-08 NOTE — Discharge Instructions (Signed)
Evaluation today for your vomiting was overall reassuring. Suspect it is viral gastroenteritis or cannabinoid hyperemesis syndrome.  Treatment for both is the same which is conservative at home.  Conservative treatment includes assertive rehydration with water and Gatorade.  If you start to have worsening abdominal pain, inability to tolerate fluid intake, blood in the stool or vomit or any other concerning symptom please return emergency department further evaluation.  I have sent Zofran to your pharmacy to treat nausea and vomiting at home.  Also recommend you follow-up with your PCP.

## 2022-10-09 ENCOUNTER — Other Ambulatory Visit (HOSPITAL_BASED_OUTPATIENT_CLINIC_OR_DEPARTMENT_OTHER): Payer: Self-pay

## 2022-10-19 ENCOUNTER — Other Ambulatory Visit (HOSPITAL_BASED_OUTPATIENT_CLINIC_OR_DEPARTMENT_OTHER): Payer: Self-pay

## 2023-01-12 IMAGING — DX DG FOREARM 2V*L*
2 series · 2 of 2 positions shown · non-contrast
Comparison: Left hand elbow x-rays dated November 27, 2018.

CLINICAL DATA: Left forearm swelling after it was shut inside a
sliding glass door.

EXAM:
LEFT FOREARM - 2 VIEW

[forearm ap]
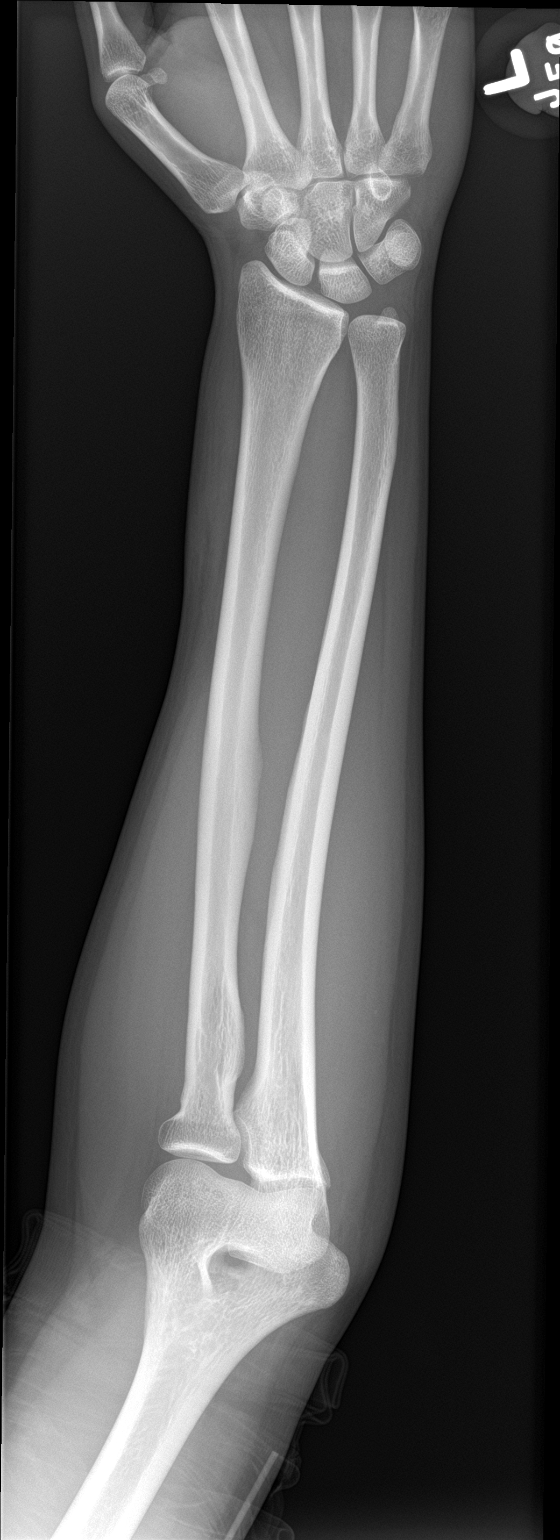

[forearm lat]
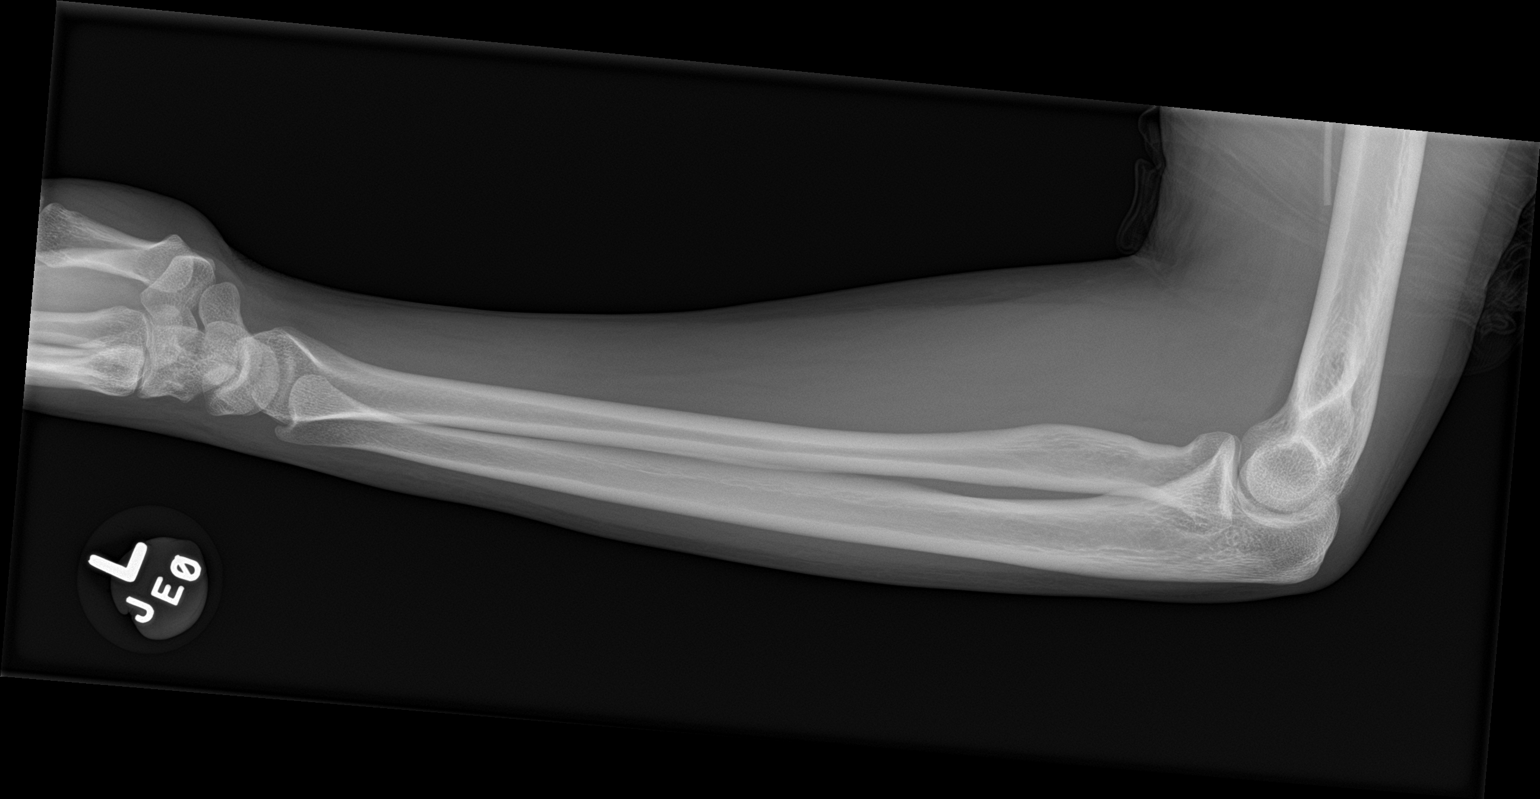

[2 of 2 positions shown; findings below may reference images not displayed]

FINDINGS: There is no evidence of fracture or other focal bone lesions.
Partially visualized birth control implant in the medial distal
upper arm. Soft tissues are otherwise unremarkable.
IMPRESSION: Negative.

## 2023-05-19 ENCOUNTER — Emergency Department (HOSPITAL_BASED_OUTPATIENT_CLINIC_OR_DEPARTMENT_OTHER)
Admission: EM | Admit: 2023-05-19 | Discharge: 2023-05-19 | Disposition: A | Discharge status: Home / Self Care | Payer: Medicaid Other | Attending: Emergency Medicine | Admitting: Emergency Medicine

## 2023-05-19 ENCOUNTER — Other Ambulatory Visit: Payer: Self-pay

## 2023-05-19 ENCOUNTER — Encounter (HOSPITAL_BASED_OUTPATIENT_CLINIC_OR_DEPARTMENT_OTHER): Payer: Self-pay

## 2023-05-19 ENCOUNTER — Emergency Department (HOSPITAL_BASED_OUTPATIENT_CLINIC_OR_DEPARTMENT_OTHER): Payer: Medicaid Other

## 2023-05-19 DIAGNOSIS — K5900 Constipation, unspecified: Secondary | ICD-10-CM | POA: Diagnosis not present

## 2023-05-19 DIAGNOSIS — Z91010 Allergy to peanuts: Secondary | ICD-10-CM | POA: Diagnosis not present

## 2023-05-19 DIAGNOSIS — Z20822 Contact with and (suspected) exposure to covid-19: Secondary | ICD-10-CM | POA: Diagnosis not present

## 2023-05-19 DIAGNOSIS — R059 Cough, unspecified: Secondary | ICD-10-CM | POA: Diagnosis not present

## 2023-05-19 DIAGNOSIS — E860 Dehydration: Secondary | ICD-10-CM | POA: Insufficient documentation

## 2023-05-19 DIAGNOSIS — R112 Nausea with vomiting, unspecified: Secondary | ICD-10-CM | POA: Diagnosis present

## 2023-05-19 LAB — URINALYSIS, ROUTINE W REFLEX MICROSCOPIC
Glucose, UA: NEGATIVE mg/dL
Hgb urine dipstick: NEGATIVE
Ketones, ur: >=80 mg/dL — AB
Leukocytes,Ua: NEGATIVE
Nitrite: NEGATIVE
Protein, ur: 100 mg/dL — AB
Specific Gravity, Urine: 1.015 (ref 1.005–1.030)
pH: 7.5 (ref 5.0–8.0)

## 2023-05-19 LAB — RESP PANEL BY RT-PCR (RSV, FLU A&B, COVID)  RVPGX2
Influenza A by PCR: NEGATIVE
Influenza B by PCR: NEGATIVE
Resp Syncytial Virus by PCR: NEGATIVE
SARS Coronavirus 2 by RT PCR: NEGATIVE

## 2023-05-19 LAB — CBC
HCT: 44.6 % (ref 36.0–46.0)
Hemoglobin: 15.6 g/dL — ABNORMAL HIGH (ref 12.0–15.0)
MCH: 31.6 pg (ref 26.0–34.0)
MCHC: 35 g/dL (ref 30.0–36.0)
MCV: 90.5 fL (ref 80.0–100.0)
Platelets: 226 10*3/uL (ref 150–400)
RBC: 4.93 MIL/uL (ref 3.87–5.11)
RDW: 12.1 % (ref 11.5–15.5)
WBC: 6.3 10*3/uL (ref 4.0–10.5)
nRBC: 0 % (ref 0.0–0.2)

## 2023-05-19 LAB — LIPASE, BLOOD: Lipase: 27 U/L (ref 11–51)

## 2023-05-19 LAB — URINALYSIS, MICROSCOPIC (REFLEX)

## 2023-05-19 LAB — HCG, SERUM, QUALITATIVE: Preg, Serum: NEGATIVE

## 2023-05-19 LAB — COMPREHENSIVE METABOLIC PANEL
ALT: 20 U/L (ref 0–44)
AST: 30 U/L (ref 15–41)
Albumin: 4.5 g/dL (ref 3.5–5.0)
Alkaline Phosphatase: 43 U/L (ref 38–126)
Anion gap: 13 (ref 5–15)
BUN: 12 mg/dL (ref 6–20)
CO2: 21 mmol/L — ABNORMAL LOW (ref 22–32)
Calcium: 9.7 mg/dL (ref 8.9–10.3)
Chloride: 103 mmol/L (ref 98–111)
Creatinine, Ser: 0.92 mg/dL (ref 0.44–1.00)
GFR, Estimated: >60 mL/min (ref >60)
Glucose, Bld: 109 mg/dL — ABNORMAL HIGH (ref 70–99)
Potassium: 3.5 mmol/L (ref 3.5–5.1)
Sodium: 137 mmol/L (ref 135–145)
Total Bilirubin: 1 mg/dL (ref <1.2)
Total Protein: 8.5 g/dL — ABNORMAL HIGH (ref 6.5–8.1)

## 2023-05-19 LAB — MAGNESIUM: Magnesium: 2 mg/dL (ref 1.7–2.4)

## 2023-05-19 LAB — CBG MONITORING, ED: Glucose-Capillary: 108 mg/dL — ABNORMAL HIGH (ref 70–99)

## 2023-05-19 MED ORDER — HALOPERIDOL 0.5 MG PO TABS: 0.5000 mg | ORAL_TABLET | Freq: Two times a day (BID) | ORAL | 0 refills | Status: DC | PRN

## 2023-05-19 MED ORDER — HALOPERIDOL LACTATE 5 MG/ML IJ SOLN
5.0000 mg | Freq: Once | INTRAMUSCULAR | Status: AC
  Administered 2023-05-19: 5 mg via INTRAVENOUS
  Filled 2023-05-19: qty 1

## 2023-05-19 MED ORDER — SODIUM CHLORIDE 0.9 % IV BOLUS
1000.0000 mL | Freq: Once | INTRAVENOUS | Status: AC
  Administered 2023-05-19: 1000 mL via INTRAVENOUS

## 2023-05-19 NOTE — ED Notes (Signed)
Patient able to drink water without nausea.

## 2023-05-19 NOTE — ED Triage Notes (Signed)
The patient has had a cough and vomiting for three days.

## 2023-05-19 NOTE — ED Provider Notes (Signed)
[place] [facility] Provider Note   CSN: [id number] Arrival date & time: 05/19/23  1813     History  Chief Complaint  Patient presents with   Emesis    Maria Farmer is a 27 y.o. female.  The history is provided by the patient and medical records. No language interpreter was used.  Emesis Severity:  Severe Duration:  3 days Timing:  Constant Quality:  Stomach contents Progression:  Unchanged Chronicity:  Recurrent Urine output: foul smelling. Relieved by:  Nothing Worsened by:  Nothing Ineffective treatments:  None tried Associated symptoms: abdominal pain (cramping), chills and cough   Associated symptoms: no diarrhea, no fever, no headaches, no myalgias and no URI        Home Medications Prior to Admission medications   Medication Sig Start Date End Date Taking? Authorizing Provider  cephALEXin (KEFLEX) 250 MG capsule Take 1 capsule (250 mg total) by mouth 4 (four) times daily. 10/05/22   Jeanelle Malling, PA  ondansetron (ZOFRAN) 4 MG tablet Take 1 tablet (4 mg total) by mouth every 6 (six) hours. 10/08/22   Gareth Eagle, PA-C  promethazine (PHENERGAN) 12.5 MG tablet Take 1 tablet (12.5 mg total) by mouth every 6 (six) hours as needed for nausea or vomiting. Patient not taking: Reported on 10/05/2022 02/13/22   Calvert Cantor, CNM  promethazine (PHENERGAN) 25 MG tablet Take 1 tablet (25 mg total) by mouth every 6 (six) hours as needed for nausea or vomiting. 10/05/22   Jeanelle Malling, PA      Allergies    Peanuts [peanut oil]    Review of Systems   Review of Systems  Constitutional:  Positive for chills. Negative for diaphoresis, fatigue and fever.  HENT:  Positive for congestion.   Respiratory:  Positive for cough. Negative for chest tightness, shortness of breath and wheezing.   Cardiovascular:  Negative for chest pain and palpitations.  Gastrointestinal:  Positive for abdominal pain (cramping), constipation, nausea and vomiting.  Negative for abdominal distention and diarrhea.  Genitourinary:  Negative for dysuria, flank pain and pelvic pain.       Stronger smelling urine  Musculoskeletal:  Negative for back pain, myalgias, neck pain and neck stiffness.  Skin:  Negative for rash and wound.  Neurological:  Negative for weakness, light-headedness, numbness and headaches.  Psychiatric/Behavioral:  Negative for agitation.   All other systems reviewed and are negative.   Physical Exam Updated Vital Signs BP 130/62   Pulse 87   Temp 98.9 F (37.2 C) (Oral)   Resp 18   Ht 5\' 2"  (1.575 m)   Wt 51 kg   LMP 04/29/2023 (Approximate)   SpO2 100%   BMI 20.56 kg/m  Physical Exam Vitals and nursing note reviewed.  Constitutional:      General: She is not in acute distress.    Appearance: She is well-developed. She is not ill-appearing, toxic-appearing or diaphoretic.  HENT:     Head: Normocephalic and atraumatic.     Right Ear: External ear normal.     Left Ear: External ear normal.     Nose: Nose normal. No congestion or rhinorrhea.     Mouth/Throat:     Mouth: Mucous membranes are dry.     Pharynx: No oropharyngeal exudate or posterior oropharyngeal erythema.  Eyes:     Extraocular Movements: Extraocular movements intact.     Conjunctiva/sclera: Conjunctivae normal.     Pupils: Pupils are equal, round, and reactive to light.  Cardiovascular:     Rate and Rhythm: Normal rate.  Pulmonary:     Effort: No respiratory distress.     Breath sounds: No stridor. No wheezing, rhonchi or rales.  Chest:     Chest wall: No tenderness.  Abdominal:     General: Abdomen is flat. There is no distension.     Tenderness: There is no abdominal tenderness. There is no guarding or rebound.     Hernia: No hernia is present.  Musculoskeletal:        General: No tenderness.     Cervical back: Normal range of motion and neck supple. No tenderness.  Skin:    General: Skin is warm.     Findings: No erythema or rash.   Neurological:     General: No focal deficit present.     Mental Status: She is alert and oriented to person, place, and time.     Sensory: No sensory deficit.     Motor: No weakness or abnormal muscle tone.     Coordination: Coordination normal.     Deep Tendon Reflexes: Reflexes are normal and symmetric.     ED Results / Procedures / Treatments   Labs (all labs ordered are listed, but only abnormal results are displayed) Labs Reviewed  COMPREHENSIVE METABOLIC PANEL - Abnormal; Notable for the following components:      Result Value   CO2 21 (*)    Glucose, Bld 109 (*)    Total Protein 8.5 (*)    All other components within normal limits  CBC - Abnormal; Notable for the following components:   Hemoglobin 15.6 (*)    All other components within normal limits  URINALYSIS, ROUTINE W REFLEX MICROSCOPIC - Abnormal; Notable for the following components:   Bilirubin Urine SMALL (*)    Ketones, ur >=80 (*)    Protein, ur 100 (*)    All other components within normal limits  URINALYSIS, MICROSCOPIC (REFLEX) - Abnormal; Notable for the following components:   Bacteria, UA FEW (*)    All other components within normal limits  CBG MONITORING, ED - Abnormal; Notable for the following components:   Glucose-Capillary 108 (*)    All other components within normal limits  RESP PANEL BY RT-PCR (RSV, FLU A&B, COVID)  RVPGX2  LIPASE, BLOOD  HCG, SERUM, QUALITATIVE  MAGNESIUM    EKG EKG Interpretation Date/Time:  Sunday May 19 2023 18:18:27 EST Ventricular Rate:  94 PR Interval:  132 QRS Duration:  79 QT Interval:  360 QTC Calculation: 451 R Axis:   75  Text Interpretation: Sinus rhythm Borderline T wave abnormalities when compared to prior, faster rate NO STEMI Confirmed by Theda Belfast ([id number]) on 05/19/2023 6:30:24 PM  Radiology DG Chest Portable 1 View  Result Date: 05/19/2023 CLINICAL DATA:  Cough. EXAM: PORTABLE CHEST 1 VIEW COMPARISON:  07/21/2020 FINDINGS: The heart  size and mediastinal contours are within normal limits. Both lungs are clear. Curvature of the thoracic spine appears convex towards the right. IMPRESSION: No active disease. Electronically Signed   By: Signa Kell M.D.   On: 05/19/2023 18:52    Procedures Procedures    Medications Ordered in ED Medications  sodium chloride 0.9 % bolus 1,000 mL ( Intravenous Stopped 05/19/23 1927)  haloperidol lactate (HALDOL) injection 5 mg (5 mg Intravenous Given 05/19/23 1827)    ED Course/ Medical Decision Making/ A&P  Medical Decision Making Amount and/or Complexity of Data Reviewed Labs: ordered. Radiology: ordered.  Risk Prescription drug management.    Maria Farmer is a 27 y.o. female with a past medical history significant for depression and documentation of previous cyclical vomiting syndrome and cannabinoid hyperemesis syndrome who presents with chills, cough, congestion, nausea, vomiting, constipation, and strong smelling urine.  According to patient, 2 weeks ago she was seen for recurrent nausea and vomiting that they attributed to cannabinoid hyperemesis at other facility.  She reports she has stopped smoking marijuana since then but had 3 days of nausea vomiting again.  She does report she has had some cough, congestion, chills, constipation, and some strong smelling urine for the last few days as well.  She denies any known COVID exposures.  Denies any chest pain or shortness bed this time.  Had some cramping with her vomiting but otherwise is denying significant abdominal pain.  Denies any back or flank pain.  Reports no trauma or falls.  Denies any blood in her emesis or stools.  Denies any vaginal complaints such as discharge bleeding or pelvic pain.  Patient does states she has had some lightheadedness and syncopal episodes today and what she thinks is the setting of dehydration.  On exam, lungs clear.  Chest nontender.  Abdomen not focally tender  but generally sore.  Bowel sounds were appreciated.  Flanks nontender.  Mucous membranes are dry.  Pupils symmetric and reactive with normal extraocular movements.  No focal neurologic deficit initially.  Patient was nauseous.  Vital signs are reassuring on evaluation with no tachycardia, tachypnea, hypoxia, fever, and blood pressure was 130 systolic.  She is otherwise well-appearing aside from the vomiting.  As patient said she has been seen multiple times in the past for this nausea vomiting of low suspicion this is a more nefarious intra-abdominal pathology.  She agrees to hold on CT imaging initially but we will get some labs as she says she recently had low magnesium.  Will add on a magnesium level.  Will give her some fluids.  She reports that the medicine they gave her last time worked very well and chart was reviewed and it appears she received Haldol that helped.  Will give her a dose of Haldol.  EKG did not show prolonged QT.  With her cough and congestion we will check her for COVID/flu/RSV and get a chest x-ray.  If she is feeling better after fluids and Haldol and her workup reassuring, anticipate discharge for outpatient follow-up.     8:41 PM Urinalysis does not show UTI.  Labs overall reassuring as we discussed together.  She does have evidence of dehydration.  She was able to tolerate p.o. challenge after the Haldol and fluids.  She would like to go home now.  Patient given several tablets prescription for low-dose Haldol as we discussed this would not be something to start more long-term but just take if she is having uncontrolled nausea and vomiting again.  She will follow-up with PCP and we recommend outpatient GI follow-up as well.  She agreed with plan of care and discharge instructions and was discharged in good condition with improved symptoms.        Final Clinical Impression(s) / ED Diagnoses Final diagnoses:  Nausea and vomiting, unspecified vomiting type  Dehydration   Constipation, unspecified constipation type  Cough, unspecified type    Rx / DC Orders ED Discharge Orders          Ordered  haloperidol (HALDOL) 0.5 MG tablet  2 times daily PRN        05/19/23 2039            Clinical Impression: 1. Nausea and vomiting, unspecified vomiting type   2. Dehydration   3. Constipation, unspecified constipation type   4. Cough, unspecified type     Disposition: Discharge  Condition: Good  I have discussed the results, Dx and Tx plan with the pt(& family if present). He/she/they expressed understanding and agree(s) with the plan. Discharge instructions discussed at great length. Strict return precautions discussed and pt &/or family have verbalized understanding of the instructions. No further questions at time of discharge.    New Prescriptions   HALOPERIDOL (HALDOL) 0.5 MG TABLET    Take 1 tablet (0.5 mg total) by mouth 2 (two) times daily as needed (uncontrolled vomiting).    Follow Up: your PCP         Maria Farmer, Canary Brim, MD 05/19/23 2041

## 2023-05-19 NOTE — ED Notes (Signed)
This Tech called to waiting room, found pt laying on floor with family. Pt unconscious but breathing. Family states she has "passed out 3x today" just like this. Pt opened eyes and became responsive after less than 2 minutes. Pt lifted from floor to locked wheelchair x2 staff via fireman carry. Pt wheeled back to rm 12, half way back, pt became unconscious once more for approx 1 minute. Pt lifted onto ED stretcher x2 and VS/EKG obtained. RN and MD at bedside at this time and pt alert and verbal.

## 2023-05-19 NOTE — Discharge Instructions (Signed)
Your history, exam, and evaluation today were overall reassuring.  Your labs show dehydration but otherwise no significant findings as we discussed.  Your urinalysis did not show UTI and your x-ray did not show pneumonia.  Your COVID/flu/RSV test was negative.  We reviewed your chart and it showed that Haldol seem to work last time.  We gave you a dose that helped.  Although we discussed this is not a medicine to start long-term, we sent in a prescription for several tablets to use if you have recurrent severe nausea and vomiting.  Please follow-up with a primary doctor and GI doctor if your symptoms persist.  If any symptoms change or worsen acutely, return to the nearest emergency department.

## 2023-05-19 NOTE — ED Notes (Signed)
Pt unable to urinate at this time.  

## 2023-07-06 ENCOUNTER — Encounter (HOSPITAL_BASED_OUTPATIENT_CLINIC_OR_DEPARTMENT_OTHER): Payer: Self-pay | Admitting: Emergency Medicine

## 2023-07-06 ENCOUNTER — Other Ambulatory Visit: Payer: Self-pay

## 2023-07-06 ENCOUNTER — Emergency Department (HOSPITAL_BASED_OUTPATIENT_CLINIC_OR_DEPARTMENT_OTHER)
Admission: EM | Admit: 2023-07-06 | Discharge: 2023-07-06 | Discharge status: Against Medical Advice | Payer: Medicaid Other | Attending: Emergency Medicine | Admitting: Emergency Medicine

## 2023-07-06 DIAGNOSIS — Z3A01 Less than 8 weeks gestation of pregnancy: Secondary | ICD-10-CM | POA: Diagnosis not present

## 2023-07-06 DIAGNOSIS — O219 Vomiting of pregnancy, unspecified: Secondary | ICD-10-CM | POA: Insufficient documentation

## 2023-07-06 DIAGNOSIS — Z5321 Procedure and treatment not carried out due to patient leaving prior to being seen by health care provider: Secondary | ICD-10-CM | POA: Diagnosis not present

## 2023-07-06 NOTE — ED Triage Notes (Signed)
Pt is approx [redacted] wks pregnant w/ NV x 4d

## 2023-07-06 NOTE — ED Notes (Signed)
Attempted venipuncture for labs; unable to obtain

## 2024-02-20 ENCOUNTER — Emergency Department (HOSPITAL_BASED_OUTPATIENT_CLINIC_OR_DEPARTMENT_OTHER)

## 2024-02-20 ENCOUNTER — Other Ambulatory Visit: Payer: Self-pay

## 2024-02-20 ENCOUNTER — Emergency Department (HOSPITAL_BASED_OUTPATIENT_CLINIC_OR_DEPARTMENT_OTHER)
Admission: EM | Admit: 2024-02-20 | Discharge: 2024-02-20 | Disposition: A | Discharge status: Home / Self Care | Attending: Emergency Medicine | Admitting: Emergency Medicine

## 2024-02-20 ENCOUNTER — Encounter (HOSPITAL_BASED_OUTPATIENT_CLINIC_OR_DEPARTMENT_OTHER): Payer: Self-pay | Admitting: Emergency Medicine

## 2024-02-20 DIAGNOSIS — O4691 Antepartum hemorrhage, unspecified, first trimester: Secondary | ICD-10-CM | POA: Diagnosis present

## 2024-02-20 DIAGNOSIS — Z91010 Allergy to peanuts: Secondary | ICD-10-CM | POA: Insufficient documentation

## 2024-02-20 DIAGNOSIS — O208 Other hemorrhage in early pregnancy: Secondary | ICD-10-CM

## 2024-02-20 DIAGNOSIS — Z3A01 Less than 8 weeks gestation of pregnancy: Secondary | ICD-10-CM | POA: Insufficient documentation

## 2024-02-20 DIAGNOSIS — O4690 Antepartum hemorrhage, unspecified, unspecified trimester: Secondary | ICD-10-CM

## 2024-02-20 LAB — CBC WITH DIFFERENTIAL/PLATELET
Abs Immature Granulocytes: 0.01 K/uL (ref 0.00–0.07)
Basophils Absolute: 0 K/uL (ref 0.0–0.1)
Basophils Relative: 1 %
Eosinophils Absolute: 0 K/uL (ref 0.0–0.5)
Eosinophils Relative: 1 %
HCT: 37.5 % (ref 36.0–46.0)
Hemoglobin: 13.3 g/dL (ref 12.0–15.0)
Immature Granulocytes: 0 %
Lymphocytes Relative: 42 %
Lymphs Abs: 1.4 K/uL (ref 0.7–4.0)
MCH: 32 pg (ref 26.0–34.0)
MCHC: 35.5 g/dL (ref 30.0–36.0)
MCV: 90.4 fL (ref 80.0–100.0)
Monocytes Absolute: 0.4 K/uL (ref 0.1–1.0)
Monocytes Relative: 11 %
Neutro Abs: 1.5 K/uL — ABNORMAL LOW (ref 1.7–7.7)
Neutrophils Relative %: 45 %
Platelets: 124 K/uL — ABNORMAL LOW (ref 150–400)
RBC: 4.15 MIL/uL (ref 3.87–5.11)
RDW: 11.5 % (ref 11.5–15.5)
WBC: 3.4 K/uL — ABNORMAL LOW (ref 4.0–10.5)
nRBC: 0 % (ref 0.0–0.2)

## 2024-02-20 LAB — URINALYSIS, ROUTINE W REFLEX MICROSCOPIC
Bilirubin Urine: NEGATIVE
Glucose, UA: NEGATIVE mg/dL
Hgb urine dipstick: NEGATIVE
Ketones, ur: NEGATIVE mg/dL
Leukocytes,Ua: NEGATIVE
Nitrite: NEGATIVE
Protein, ur: NEGATIVE mg/dL
Specific Gravity, Urine: 1.025 (ref 1.005–1.030)
pH: 6.5 (ref 5.0–8.0)

## 2024-02-20 LAB — PREGNANCY, URINE: Preg Test, Ur: POSITIVE — AB

## 2024-02-20 LAB — HCG, QUANTITATIVE, PREGNANCY: hCG, Beta Chain, Quant, S: 7566 m[IU]/mL — ABNORMAL HIGH (ref <5)

## 2024-02-20 NOTE — ED Notes (Signed)
 Pt in ultrasound

## 2024-02-20 NOTE — ED Provider Notes (Signed)
 [facility] [place] Provider Note   CSN: [id number] Arrival date & time: 02/20/24  9043     Patient presents with: Vaginal Bleeding   Maria Farmer is a 28 y.o. female.   Patient presents to the emergency department today for evaluation of vaginal bleeding.  Patient reports positive home pregnancy test x 2.  Last menstrual period 01/11/2024.  Yesterday she had a gush of blood.  This was an isolated event.  No further bleeding.  No associated abdominal pain, back pain or other cramping.  No other vaginal discharge.        Prior to Admission medications   Medication Sig Start Date End Date Taking? Authorizing Provider  cephALEXin  (KEFLEX ) 250 MG capsule Take 1 capsule (250 mg total) by mouth 4 (four) times daily. 10/05/22   Ladora Congress, PA  haloperidol  (HALDOL ) 0.5 MG tablet Take 1 tablet (0.5 mg total) by mouth 2 (two) times daily as needed (uncontrolled vomiting). 05/19/23   Tegeler, Lonni PARAS, MD  ondansetron  (ZOFRAN ) 4 MG tablet Take 1 tablet (4 mg total) by mouth every 6 (six) hours. 10/08/22   Robinson, John K, PA-C  promethazine  (PHENERGAN ) 12.5 MG tablet Take 1 tablet (12.5 mg total) by mouth every 6 (six) hours as needed for nausea or vomiting. Patient not taking: Reported on 10/05/2022 02/13/22   Gene Lucie BROCKS, CNM  promethazine  (PHENERGAN ) 25 MG tablet Take 1 tablet (25 mg total) by mouth every 6 (six) hours as needed for nausea or vomiting. 10/05/22   Ladora Congress, PA    Allergies: Peanuts [peanut oil]    Review of Systems  Updated Vital Signs BP (!) 91/52   Temp 98.2 F (36.8 C) (Oral)   Resp 16   Wt 61.2 kg   LMP 01/11/2024 (Exact Date)   BMI 23.91 kg/m   Physical Exam Vitals and nursing note reviewed.  Constitutional:      Appearance: She is well-developed.  HENT:     Head: Normocephalic and atraumatic.  Eyes:     Conjunctiva/sclera: Conjunctivae normal.  Pulmonary:     Effort: No respiratory distress.  Abdominal:      Tenderness: There is no abdominal tenderness. There is no guarding or rebound.     Comments: Non gravid abdomen  Musculoskeletal:     Cervical back: Normal range of motion and neck supple.  Skin:    General: Skin is warm and dry.  Neurological:     Mental Status: She is alert.     (all labs ordered are listed, but only abnormal results are displayed) Labs Reviewed  CBC WITH DIFFERENTIAL/PLATELET - Abnormal; Notable for the following components:      Result Value   WBC 3.4 (*)    Platelets 124 (*)    Neutro Abs 1.5 (*)    All other components within normal limits  PREGNANCY, URINE - Abnormal; Notable for the following components:   Preg Test, Ur POSITIVE (*)    All other components within normal limits  HCG, QUANTITATIVE, PREGNANCY - Abnormal; Notable for the following components:   hCG, Beta Chain, Quant, S 7,566 (*)    All other components within normal limits  URINALYSIS, ROUTINE W REFLEX MICROSCOPIC    EKG: None  Radiology: No results found.   Procedures   Medications Ordered in the ED - No data to display  ED Course  Patient seen and examined. History obtained directly from patient. Work-up including labs, imaging, EKG ordered in triage, if performed, were  reviewed.    Labs/EKG: Independently reviewed and interpreted.  This included: Pregnancy test positive, CBC unremarkable.  Awaiting quant.  Imaging: Ordered pelvic ultrasound.  Medications/Fluids: None ordered  Most recent vital signs reviewed and are as follows: BP (!) 91/52   Temp 98.2 F (36.8 C) (Oral)   Resp 16   Wt 61.2 kg   LMP 01/11/2024 (Exact Date)   BMI 23.91 kg/m   Initial impression: First trimester vaginal bleeding, will need to evaluate for intrauterine pregnancy, possible miscarriage.  12:09 PM Reassessment performed. Patient appears stable.  Labs personally reviewed and interpreted including: Quant 7566  Imaging results reviewed: Pelvic ultrasound shows intrauterine  pregnancy, associated subchorionic hemorrhage which is likely the cause of the patient's bleeding.  Early pregnancy, unable to confirm heartbeat.  Patient will need follow-up.  Reviewed pertinent lab work and imaging with patient at bedside. Questions answered.   Most current vital signs reviewed and are as follows: BP (!) 115/52   Pulse 70   Temp 98.3 F (36.8 C) (Oral)   Resp 16   Wt 61.2 kg   LMP 01/11/2024 (Exact Date)   SpO2 100%   BMI 23.91 kg/m   Plan: Discharge to home.   Prescriptions written for: None  Other home care instructions discussed: Close monitoring of symptoms, rest  ED return instructions discussed: Development of abdominal or back pain, heavier bleeding  Follow-up instructions discussed: Patient encouraged to follow-up with their OB/GYN.  Discussed that she will need follow-up appointment for repeat ultrasound.  Encouraged her to call her OB/GYN for follow-up.                                  Medical Decision Making Amount and/or Complexity of Data Reviewed Labs: ordered. Radiology: ordered.   Patient with vaginal bleeding, first trimester of pregnancy.  She has an intrauterine pregnancy noted, albeit early.  There is associated subchorionic hemorrhage.  No evidence for ectopic pregnancy today.  Patient looks well, currently asymptomatic.  She has OB/GYN follow-up.  Discussed importance of follow-up given early pregnancy.  We discussed return precautions as above.     Final diagnoses:  Subchorionic hemorrhage in first trimester  Vaginal bleeding in pregnancy    ED Discharge Orders     None          Desiderio Chew, PA-C 02/20/24 1211    Emil Share, DO 02/20/24 1217

## 2024-02-20 NOTE — ED Triage Notes (Signed)
 Reports positive preg test 2 weeks ago , yesterday had  a gush of blood  , bleeding stopped today with small spotting . Denies abd pain or back pain

## 2024-02-20 NOTE — Discharge Instructions (Addendum)
 Please read and follow all provided instructions.  Your diagnoses today include:  1. Subchorionic hemorrhage in first trimester   2. Vaginal bleeding in pregnancy     Tests performed today include: Complete blood cell count: Your white blood cell count was normal Pregnancy hormone: Was 7566, this may be rechecked by your GYN to ensure that it is increasing appropriately Ultrasound shows early pregnancy at 5 weeks 5 days gestation, shows pregnancy in the uterus, cannot confirm a heartbeat at this time but it may be too early to see this Vital signs. See below for your results today.   Medications prescribed:  None  Take any prescribed medications only as directed.  Home care instructions:  Follow any educational materials contained in this packet.  BE VERY CAREFUL not to take multiple medicines containing Tylenol  (also called acetaminophen ). Doing so can lead to an overdose which can damage your liver and cause liver failure and possibly death.   Follow-up instructions: Please follow-up with your OB/GYN.  Please call for a follow-up appointment.  You will likely need a repeat ultrasound to ensure health of the pregnancy.  Your OB/GYN will help determine when this can be done.  Return instructions:  Please return to the Emergency Department if you experience worsening symptoms.  Please return with development of vaginal bleeding, abdominal pain or back pain Please return if you have any other emergent concerns.  Additional Information:  Your vital signs today were: BP (!) 115/52   Pulse 70   Temp 98.3 F (36.8 C) (Oral)   Resp 16   Wt 61.2 kg   LMP 01/11/2024 (Exact Date)   SpO2 100%   BMI 23.91 kg/m  If your blood pressure (BP) was elevated above 135/85 this visit, please have this repeated by your doctor within one month. --------------

## 2024-02-20 NOTE — ED Notes (Signed)
 ED Provider at bedside.

## 2024-02-24 ENCOUNTER — Emergency Department (HOSPITAL_BASED_OUTPATIENT_CLINIC_OR_DEPARTMENT_OTHER)
Admission: EM | Admit: 2024-02-24 | Discharge: 2024-02-25 | Disposition: A | Discharge status: Home / Self Care | Attending: Emergency Medicine | Admitting: Emergency Medicine

## 2024-02-24 ENCOUNTER — Encounter (HOSPITAL_BASED_OUTPATIENT_CLINIC_OR_DEPARTMENT_OTHER): Payer: Self-pay | Admitting: *Deleted

## 2024-02-24 ENCOUNTER — Other Ambulatory Visit: Payer: Self-pay

## 2024-02-24 DIAGNOSIS — E876 Hypokalemia: Secondary | ICD-10-CM | POA: Insufficient documentation

## 2024-02-24 DIAGNOSIS — R112 Nausea with vomiting, unspecified: Secondary | ICD-10-CM

## 2024-02-24 DIAGNOSIS — O210 Mild hyperemesis gravidarum: Secondary | ICD-10-CM | POA: Diagnosis not present

## 2024-02-24 DIAGNOSIS — R55 Syncope and collapse: Secondary | ICD-10-CM | POA: Diagnosis present

## 2024-02-24 DIAGNOSIS — Z91010 Allergy to peanuts: Secondary | ICD-10-CM | POA: Insufficient documentation

## 2024-02-24 DIAGNOSIS — N938 Other specified abnormal uterine and vaginal bleeding: Secondary | ICD-10-CM | POA: Insufficient documentation

## 2024-02-24 LAB — CBC
HCT: 39.8 % (ref 36.0–46.0)
Hemoglobin: 14.6 g/dL (ref 12.0–15.0)
MCH: 31.8 pg (ref 26.0–34.0)
MCHC: 36.7 g/dL — ABNORMAL HIGH (ref 30.0–36.0)
MCV: 86.7 fL (ref 80.0–100.0)
Platelets: 195 K/uL (ref 150–400)
RBC: 4.59 MIL/uL (ref 3.87–5.11)
RDW: 11.2 % — ABNORMAL LOW (ref 11.5–15.5)
WBC: 5.5 K/uL (ref 4.0–10.5)
nRBC: 0 % (ref 0.0–0.2)

## 2024-02-24 LAB — COMPREHENSIVE METABOLIC PANEL WITH GFR
ALT: 44 U/L (ref 0–44)
AST: 43 U/L — ABNORMAL HIGH (ref 15–41)
Albumin: 4.3 g/dL (ref 3.5–5.0)
Alkaline Phosphatase: 40 U/L (ref 38–126)
Anion gap: 18 — ABNORMAL HIGH (ref 5–15)
BUN: 9 mg/dL (ref 6–20)
CO2: 17 mmol/L — ABNORMAL LOW (ref 22–32)
Calcium: 8.9 mg/dL (ref 8.9–10.3)
Chloride: 103 mmol/L (ref 98–111)
Creatinine, Ser: 0.74 mg/dL (ref 0.44–1.00)
GFR, Estimated: >60 mL/min (ref >60)
Glucose, Bld: 134 mg/dL — ABNORMAL HIGH (ref 70–99)
Potassium: 3.1 mmol/L — ABNORMAL LOW (ref 3.5–5.1)
Sodium: 138 mmol/L (ref 135–145)
Total Bilirubin: 0.6 mg/dL (ref 0.0–1.2)
Total Protein: 6.9 g/dL (ref 6.5–8.1)

## 2024-02-24 LAB — HCG, QUANTITATIVE, PREGNANCY: hCG, Beta Chain, Quant, S: 19346 m[IU]/mL — ABNORMAL HIGH (ref <5)

## 2024-02-24 LAB — ABO/RH: ABO/RH(D): B POS

## 2024-02-24 MED ORDER — SODIUM CHLORIDE 0.9 % IV BOLUS
1000.0000 mL | Freq: Once | INTRAVENOUS | Status: AC
  Administered 2024-02-24: 1000 mL via INTRAVENOUS

## 2024-02-24 MED ORDER — SODIUM CHLORIDE 0.9 % IV SOLN
12.5000 mg | Freq: Once | INTRAVENOUS | Status: AC
  Administered 2024-02-24: 12.5 mg via INTRAVENOUS
  Filled 2024-02-24: qty 0.5

## 2024-02-24 MED ORDER — ONDANSETRON HCL 4 MG/2ML IJ SOLN
4.0000 mg | Freq: Once | INTRAMUSCULAR | Status: AC
  Administered 2024-02-24: 4 mg via INTRAVENOUS
  Filled 2024-02-24: qty 2

## 2024-02-24 MED ORDER — PROMETHAZINE HCL 25 MG RE SUPP
25.0000 mg | Freq: Four times a day (QID) | RECTAL | 0 refills | Status: AC | PRN
  Filled 2024-02-24: qty 12, 3d supply, fill #0

## 2024-02-24 MED ORDER — PROMETHAZINE HCL 25 MG PO TABS
25.0000 mg | ORAL_TABLET | Freq: Four times a day (QID) | ORAL | 0 refills | Status: AC | PRN
  Filled 2024-02-24: qty 30, 8d supply, fill #0

## 2024-02-24 MED ORDER — PROMETHAZINE HCL 25 MG/ML IJ SOLN
INTRAMUSCULAR | Status: AC
  Administered 2024-02-24: 12.5 mg
  Filled 2024-02-24: qty 1

## 2024-02-24 MED ORDER — PYRIDOXINE HCL 100 MG/ML IJ SOLN
100.0000 mg | Freq: Once | INTRAMUSCULAR | Status: AC
  Administered 2024-02-24: 100 mg via INTRAVENOUS
  Filled 2024-02-24: qty 1

## 2024-02-24 MED ORDER — POTASSIUM CHLORIDE 10 MEQ/100ML IV SOLN
10.0000 meq | Freq: Once | INTRAVENOUS | Status: AC
  Administered 2024-02-24: 10 meq via INTRAVENOUS
  Filled 2024-02-24: qty 100

## 2024-02-24 MED ORDER — METHYLPREDNISOLONE SODIUM SUCC 125 MG IJ SOLR
125.0000 mg | Freq: Once | INTRAMUSCULAR | Status: AC
  Administered 2024-02-24: 125 mg via INTRAVENOUS
  Filled 2024-02-24: qty 2

## 2024-02-24 MED ORDER — DOXYLAMINE-PYRIDOXINE 10-10 MG PO TBEC
1.0000 | DELAYED_RELEASE_TABLET | ORAL | 0 refills | Status: AC
  Filled 2024-02-24: qty 60, 15d supply, fill #0

## 2024-02-24 NOTE — ED Provider Notes (Signed)
 [facility] Provider Note   CSN: [id number] Arrival date & time: 02/24/24  1818     Patient presents with: Hyperemesis Gravidarum and Near Syncope   Trina Saling is a 28 y.o. female who presents to the ED today with primary concern of persistent vomiting since today, not tolerating any oral intake as of this morning.  She also has had a near syncopal event, continued retching.  Review of medical records does show this patient was seen on 11 September for vaginal bleeding, patient states that she has had some dark red bleeding today.  Review of previously obtained quantitative hCG was 7566, pelvic ultrasound confirmed intrauterine pregnancy with subchorionic hemorrhage.      Near Syncope       Prior to Admission medications   Medication Sig Start Date End Date Taking? Authorizing Provider  ondansetron  (ZOFRAN ) 4 MG tablet Take 1 tablet (4 mg total) by mouth every 6 (six) hours. 10/08/22   Robinson, John K, PA-C    Allergies: Peanuts [peanut oil]    Review of Systems  Cardiovascular:  Positive for near-syncope.  Gastrointestinal:  Positive for nausea and vomiting.  Genitourinary:  Positive for vaginal bleeding.  All other systems reviewed and are negative.   Updated Vital Signs BP 123/88   Pulse 64   Resp (!) 24   Wt 61.2 kg   LMP 01/11/2024 (Exact Date)   SpO2 99%   BMI 23.91 kg/m   Physical Exam Vitals and nursing note reviewed.  Constitutional:      General: She is not in acute distress.    Appearance: Normal appearance.  HENT:     Head: Normocephalic and atraumatic.     Mouth/Throat:     Mouth: Mucous membranes are moist.     Pharynx: Oropharynx is clear.  Eyes:     Extraocular Movements: Extraocular movements intact.     Conjunctiva/sclera: Conjunctivae normal.     Pupils: Pupils are equal, round, and reactive to light.  Cardiovascular:     Rate and Rhythm: Normal rate and regular rhythm.     Pulses:  Normal pulses.     Heart sounds: Normal heart sounds. No murmur heard.    No friction rub. No gallop.  Pulmonary:     Effort: Pulmonary effort is normal.     Breath sounds: Normal breath sounds.  Abdominal:     General: Abdomen is flat. Bowel sounds are normal.     Palpations: Abdomen is soft.     Tenderness: There is abdominal tenderness in the right lower quadrant and left lower quadrant. There is no guarding or rebound.  Musculoskeletal:        General: Normal range of motion.     Cervical back: Normal range of motion and neck supple.     Right lower leg: No edema.     Left lower leg: No edema.  Skin:    General: Skin is warm and dry.     Capillary Refill: Capillary refill takes less than 2 seconds.  Neurological:     General: No focal deficit present.     Mental Status: She is alert. Mental status is at baseline.  Psychiatric:        Mood and Affect: Mood normal.     (all labs ordered are listed, but only abnormal results are displayed) Labs Reviewed  COMPREHENSIVE METABOLIC PANEL WITH GFR - Abnormal; Notable for the following components:      Result Value   Potassium  3.1 (*)    CO2 17 (*)    Glucose, Bld 134 (*)    AST 43 (*)    Anion gap 18 (*)    All other components within normal limits  CBC - Abnormal; Notable for the following components:   MCHC 36.7 (*)    RDW 11.2 (*)    All other components within normal limits  HCG, QUANTITATIVE, PREGNANCY - Abnormal; Notable for the following components:   hCG, Beta Chain, Quant, S 19,346 (*)    All other components within normal limits  URINALYSIS, ROUTINE W REFLEX MICROSCOPIC  ABO/RH    EKG: None  Radiology: No results found.   Procedures   Medications Ordered in the ED  sodium chloride  0.9 % bolus 1,000 mL (0 mLs Intravenous Stopped 02/24/24 1959)  ondansetron  (ZOFRAN ) injection 4 mg (4 mg Intravenous Given 02/24/24 1856)  pyridOXINE  (VITAMIN B6) injection 100 mg (100 mg Intravenous Given 02/24/24 1857)   methylPREDNISolone  sodium succinate (SOLU-MEDROL ) 125 mg/2 mL injection 125 mg (125 mg Intravenous Given 02/24/24 2000)  sodium chloride  0.9 % bolus 1,000 mL (1,000 mLs Intravenous New Bag/Given 02/24/24 2043)  potassium chloride  10 mEq in 100 mL IVPB (10 mEq Intravenous New Bag/Given 02/24/24 2045)                                    Medical Decision Making Amount and/or Complexity of Data Reviewed Labs: ordered.  Risk Prescription drug management.   Medical Decision Making:   Shawnika Pepin is a 28 y.o. female who presented to the ED today with nausea and vomiting along with dark red vaginal bleeding detailed above.    External chart has been reviewed including previous labs and imaging. Complete initial physical exam performed, notably the patient  was alert and oriented no apparent distress with obviously in discomfort, retching.  Lower abdominal tenderness is present however remainder of physical exam is unremarkable. Reviewed and confirmed nursing documentation for past medical history, family history, social history.    Initial Assessment:   With the patient's presentation of nausea and vomiting and bleeding, consider hyperemesis gravidarum, related to the vaginal bleeding, consider continued bleeding from subchorionic hemorrhage, potential threatened miscarriage.   Initial Plan:  Provide IV hydration as well as IV ondansetron  and pyridoxine . Screening labs including CBC and Metabolic panel to evaluate for infectious or metabolic etiology of disease.  Obtain serum hCG quantitative to determine pregnancy progression. Urinalysis with reflex culture ordered to evaluate for UTI or relevant urologic/nephrologic pathology.  Objective evaluation as below reviewed   Initial Study Results:   Laboratory  All laboratory results reviewed without evidence of clinically relevant pathology.   Exceptions include: Hypokalemia noted at 3.1.  Quantitative hCG is 19 346.  Radiology:  All  images reviewed independently. Agree with radiology report at this time.   US  OB LESS THAN 14 WEEKS WITH OB TRANSVAGINAL Result Date: 02/20/2024 CLINICAL DATA:  [id number] Vaginal bleeding 890711 EXAM: OBSTETRIC <14 WK US  AND TRANSVAGINAL OB US  TECHNIQUE: Both transabdominal and transvaginal ultrasound examinations were performed for complete evaluation of the gestation as well as the maternal uterus, adnexal regions, and pelvic cul-de-sac. Transvaginal technique was performed to assess early pregnancy. COMPARISON:  None Available. FINDINGS: Intrauterine gestational sac: Single Yolk sac:  Present Fetal Pole:  Not present, at this time. Cardiac Activity: Not present, at this time. MSD: 9 mm 5w 5d Subchorionic hemorrhage: Moderate-sized subchronic hematoma, measuring 1.2 x  1.8 x 0.8 cm. Maternal uterus/adnexae: Corpus luteum in the right ovary measuring 1.9 cm. Otherwise, normal appearance of the left ovary. No free pelvic fluid. IMPRESSION: 1. Single intrauterine gestation with a yolk sac present and estimated gestational age of [redacted] weeks, 5 days. No fetal pole or fetal heartbeat visualized at this time, likely due to the earliness of the gestation. Close obstetric follow-up with a repeat first trimester pregnancy ultrasound in 14 days is recommended to assess for progression of the pregnancy. 2. Moderate-sized, subchronic hematoma, measuring 1.2 x 1.8 x 0.8 cm. Attention on the aforementioned ultrasound follow-up recommended. Electronically Signed   By: Rogelia Myers M.D.   On: 02/20/2024 11:50      Reassessment and Plan:   Reassuring finding regarding the vaginal bleeding is that quantitative hCG has increased to 19 346.  Given her state of pregnancy, find this reassuring and at this time I deferred any further ultrasonography as pregnancy seems to be progressing normally.  She does have hypokalemia which was corrected with 10 mg of IV potassium as she could not tolerate p.o. potassium tablets.  She has also  received 2 L of IV fluid, vital signs remain within normal limits with some improvement with decreased respiratory rate and decreased heart rate.  After reassessment, had initial improvement after Zofran , pyridoxine , and Solu-Medrol .  However after second reassessment, she does have some continued nausea despite ongoing therapy.  Care handed off to C.Silver, PA-C.  Disposition pending p.o. challenge, may require admission if she is unable to tolerate oral liquids.       Final diagnoses:  None    ED Discharge Orders     None          Myriam Dorn BROCKS, [place] 02/24/24 2216    Tegeler, Lonni PARAS, MD 02/24/24 [phone]

## 2024-02-24 NOTE — ED Triage Notes (Signed)
 Pt is here due to nausea and vomiting and inability to keep fluid down.  Pt is pregnant.  LMP was 01/11/2024.  Pt has been feeling weak and dizzy.  Pt had a near syncopal episode while checking in and was lowered to the ground.  No LOC.  Pt did not hit her head.  Pt was helped into a wheelchair and brought to exam room.  Pt is having nausea and dry heaves.

## 2024-02-24 NOTE — Discharge Instructions (Addendum)
 As discussed, we will place you on a few different medications to help with nausea and vomiting in the outpatient setting.  Recommend continued abstinence from marijuana.  Recommend close follow-up with your OB in the outpatient setting.

## 2024-02-24 NOTE — ED Provider Notes (Signed)
  Physical Exam  BP 106/69   Pulse 87   Resp 20   Wt 61.2 kg   LMP 01/11/2024 (Exact Date)   SpO2 100%   BMI 23.91 kg/m   Physical Exam  Procedures  Procedures  ED Course / MDM   Clinical Course as of 02/24/24 2315  Mon Feb 24, 2024  2236 Reassessment of the patient showed patient still with nausea.  Would like to try additional antiemetic before deciding on admission versus not. Conversation was had with patient regarding promethazine  along with risks/benefits and she acknowledged understanding and wanted to try this medication.  [CR]    Clinical Course User Index [CR] Silver Wonda LABOR, PA   Medical Decision Making Amount and/or Complexity of Data Reviewed Labs: ordered.  Risk Prescription drug management.   Patient care handed off from PA-C Gilliam history of change.  See prior note for more full details.  In short, 28 year old female presents emergency department complaints of nausea vomiting.  Patient seen in the ED regularly for nausea and emesis.  Was seen most recently on 11 September for vaginal bleeding.  Had hCG 7566 at that time with IUP with subchorionic hemorrhage.  Patient denies any abdominal pain/pelvic pain or current vaginal bleeding. Patient still feeling somewhat nauseated initially on exam.  Gave trial for initial antiemetic versus admission.  Patient desired for trial of additional antiemetic.   Reassessment showed improvement.  Will send home with similar medications and recommend follow-up with OB/GYN in the outpatient setting.  Treatment plan discussed with patient she is understanding was agreeable.  Patient well-appearing, afebrile in no acute distress upon discharge.  Worrisome signs and symptoms were discussed with the patient and the patient acknowledged understanding to return to the emergency room if notice.  Patient stable upon discharge.         Silver Wonda LABOR, [place] 02/24/24 2341    Tegeler, Lonni PARAS, MD 02/25/24 [phone]

## 2024-02-24 NOTE — ED Notes (Signed)
 Pt given crackers and water for PO challenge

## 2024-02-24 NOTE — ED Notes (Signed)
 Attempt IV placement x1 in left hand, blood sent to lab IV unable to thread

## 2024-02-25 ENCOUNTER — Other Ambulatory Visit (HOSPITAL_BASED_OUTPATIENT_CLINIC_OR_DEPARTMENT_OTHER): Payer: Self-pay

## 2024-02-26 ENCOUNTER — Other Ambulatory Visit (HOSPITAL_BASED_OUTPATIENT_CLINIC_OR_DEPARTMENT_OTHER): Payer: Self-pay

## 2024-03-11 ENCOUNTER — Other Ambulatory Visit (HOSPITAL_BASED_OUTPATIENT_CLINIC_OR_DEPARTMENT_OTHER): Payer: Self-pay
